# Patient Record
Sex: Female | Born: 1970 | Race: White | Hispanic: No | Marital: Married | Smoking: Former smoker
Health system: Southern US, Community
[De-identification: ages and names within clinical notes are randomized; demographics above are authoritative.]

## PROBLEM LIST (undated history)

## (undated) DIAGNOSIS — N2889 Other specified disorders of kidney and ureter: Secondary | ICD-10-CM

## (undated) DIAGNOSIS — K219 Gastro-esophageal reflux disease without esophagitis: Secondary | ICD-10-CM

## (undated) DIAGNOSIS — F419 Anxiety disorder, unspecified: Secondary | ICD-10-CM

## (undated) DIAGNOSIS — L509 Urticaria, unspecified: Secondary | ICD-10-CM

## (undated) DIAGNOSIS — L723 Sebaceous cyst: Secondary | ICD-10-CM

## (undated) DIAGNOSIS — F32A Depression, unspecified: Secondary | ICD-10-CM

## (undated) DIAGNOSIS — M1990 Unspecified osteoarthritis, unspecified site: Secondary | ICD-10-CM

## (undated) DIAGNOSIS — E0590 Thyrotoxicosis, unspecified without thyrotoxic crisis or storm: Secondary | ICD-10-CM

## (undated) DIAGNOSIS — F329 Major depressive disorder, single episode, unspecified: Secondary | ICD-10-CM

## (undated) HISTORY — PX: BREAST LUMPECTOMY: SHX2

## (undated) HISTORY — PX: COLONOSCOPY: SHX174

## (undated) HISTORY — DX: Urticaria, unspecified: L50.9

## (undated) HISTORY — PX: MOUTH SURGERY: SHX715

---

## 2016-08-29 IMAGING — US US BREAST*R* LIMITED INC AXILLA
1 series · 13 of 22 positions shown · non-contrast
Comparison: Baseline screening mammogram dated [DATE].

CLINICAL DATA: Screening recall for right breast mass and right
breast asymmetry.

EXAM:
2D DIGITAL DIAGNOSTIC UNILATERAL RIGHT MAMMOGRAM WITH CAD AND
ADJUNCT TOMO
RIGHT BREAST ULTRASOUND

[Series 1: us breast*right* limited inc axilla · 0.07mm/px · 13 of 22 slices shown]
[im 1/22]
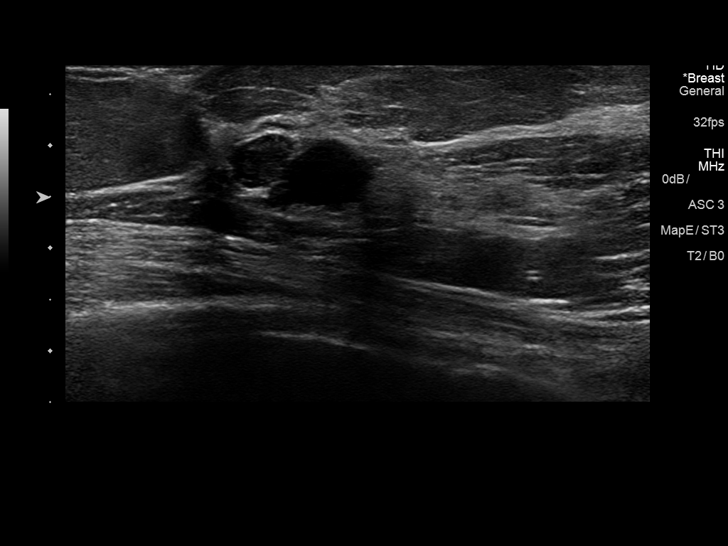
[im 3/22]
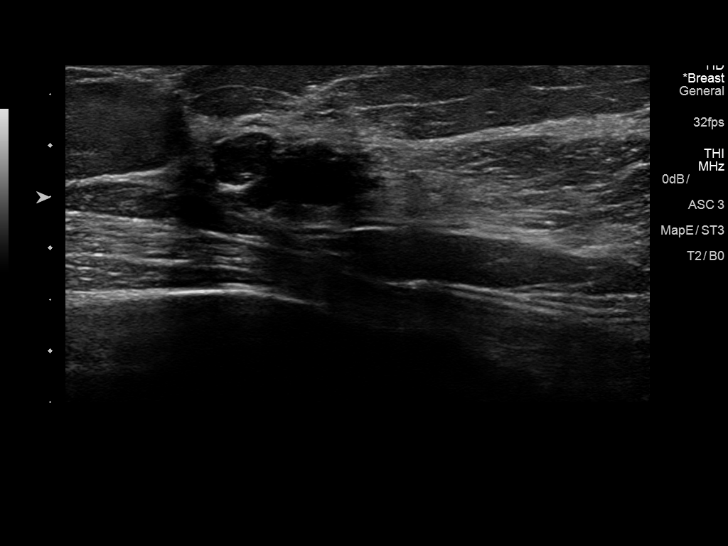
[im 5/22]
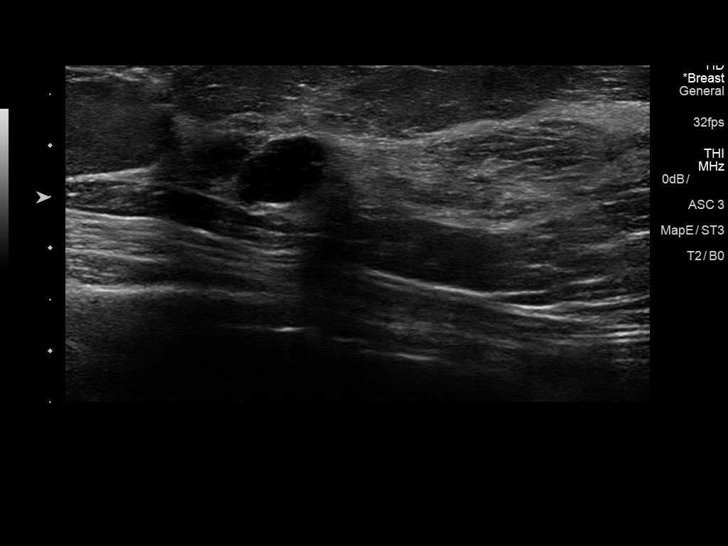
[im 6/22]
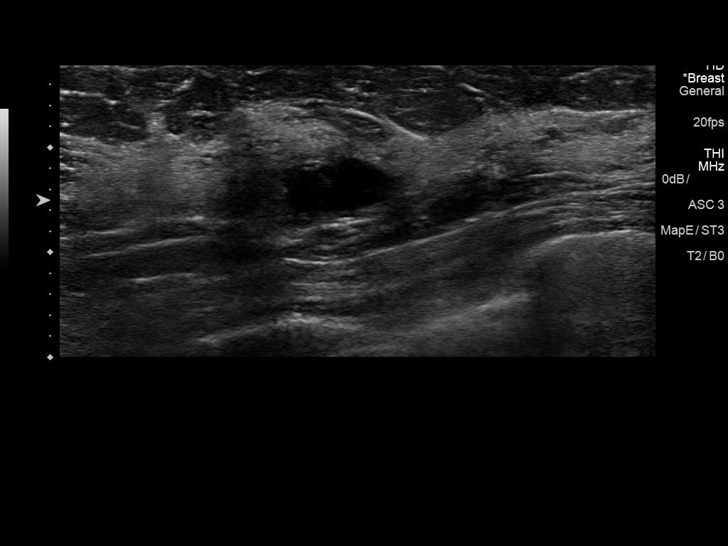
[im 8/22]
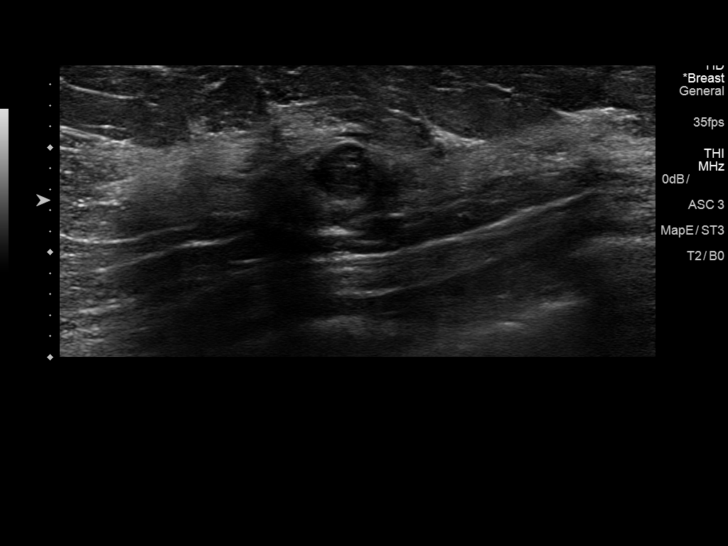
[im 10/22]
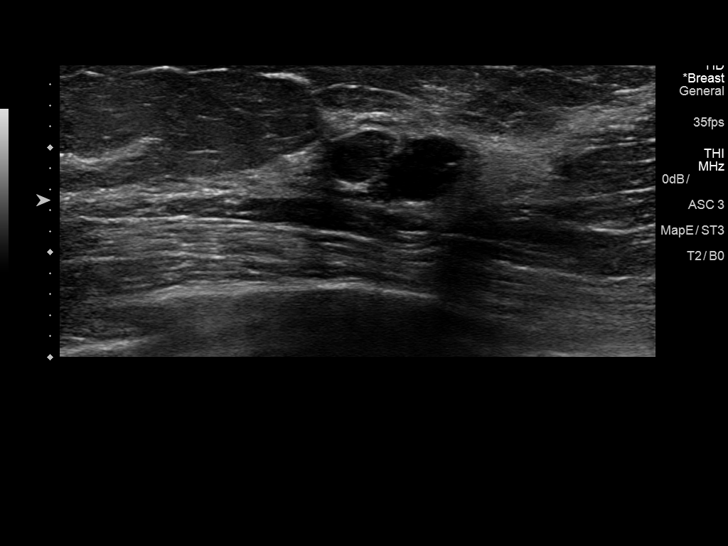
[im 12/22]
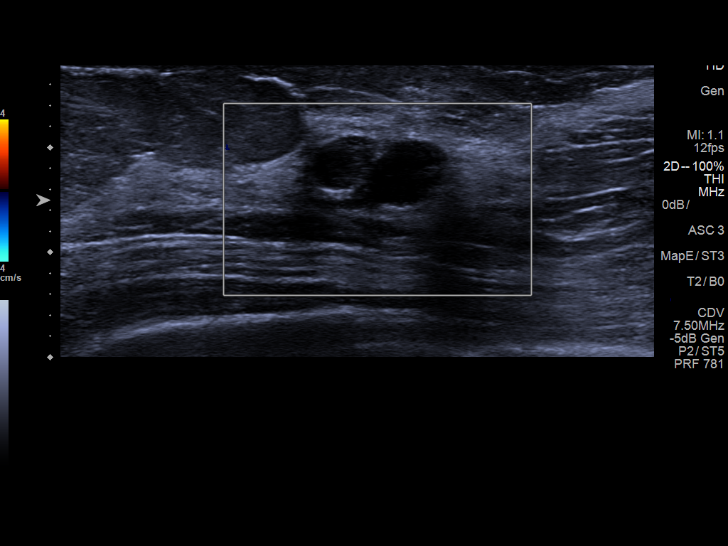
[im 13/22]
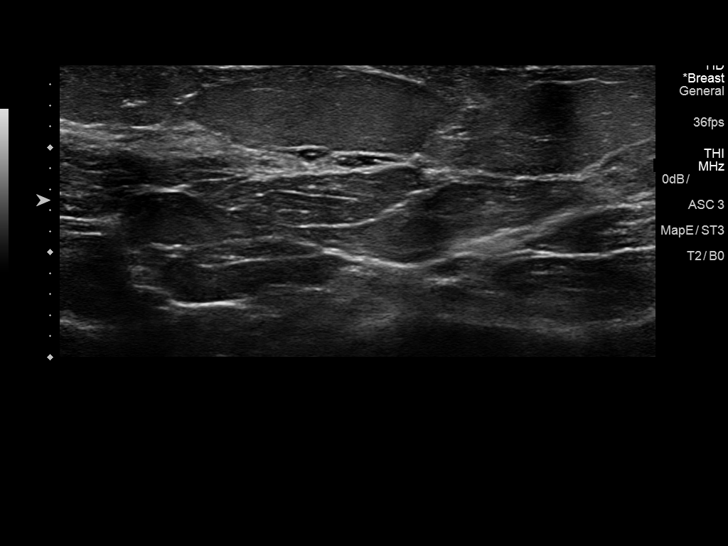
[im 15/22]
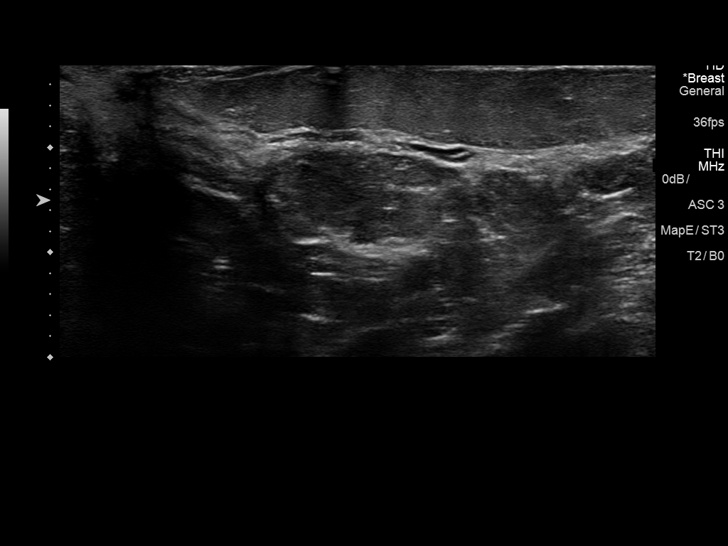
[im 17/22]
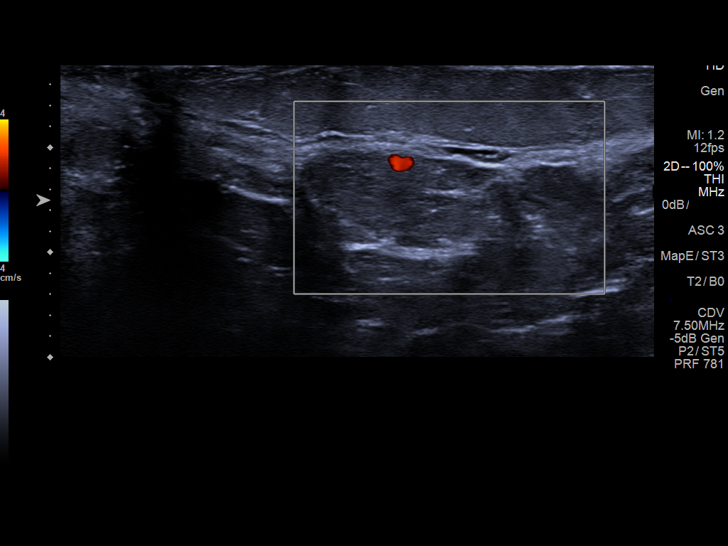
[im 18/22]
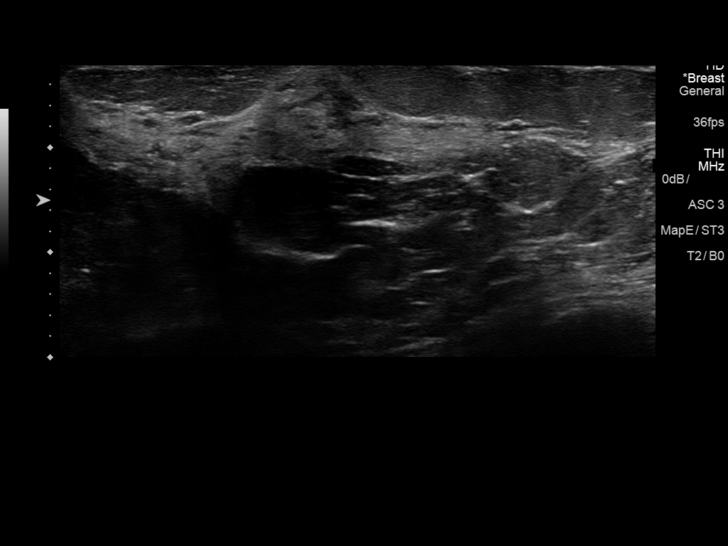
[im 20/22]
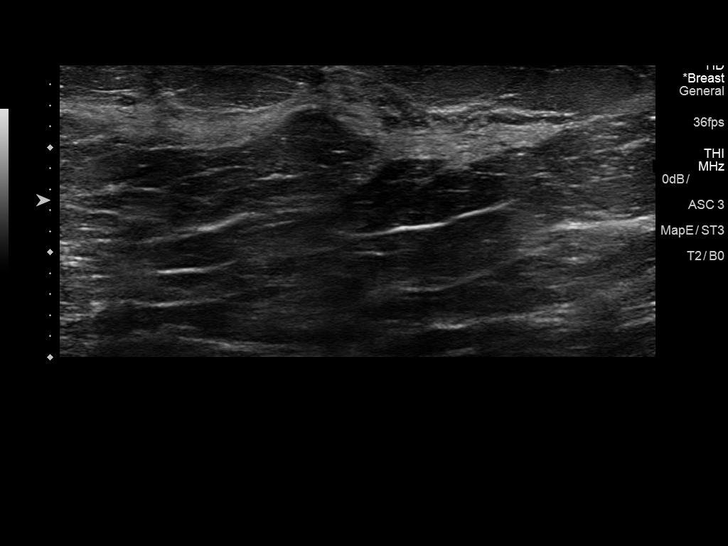
[im 22/22]
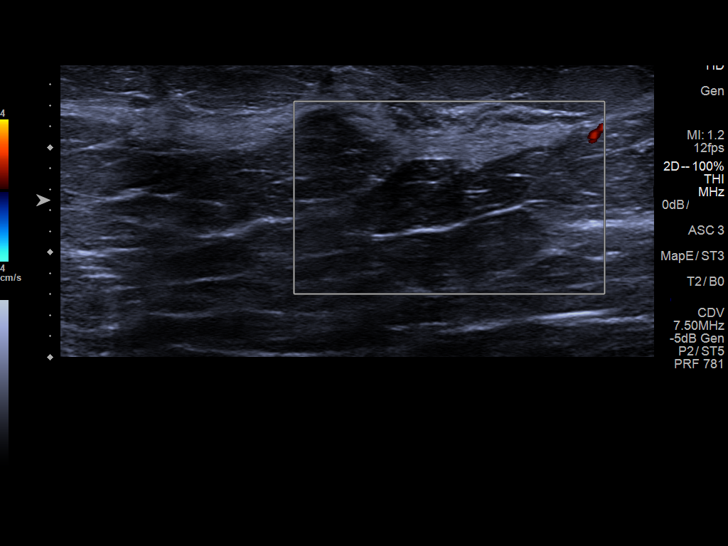

[13 of 22 positions shown; findings below may reference images not displayed]

ACR Breast Density Category c: The breast tissue is heterogeneously
dense, which may obscure small masses.
FINDINGS: Cc and MLO tomograms were performed of the right breast. There is a
persistent oval mass with obscured margins in the lower central
right breast measuring approximately 6-7 mm. In addition, there are
2 oval masses with obscured margins in the slightly upper outer
right breast measuring 7 in 9 mm.

Mammographic images were processed with CAD.

Physical examination of the outer right breast does not reveal any
palpable masses.

Targeted ultrasound of the right breast was performed demonstrating
a cluster of oval circumscribed hypoechoic masses with posterior
acoustic enhancement at 10 o'clock 6 cm from nipple with the larger
more anechoic mass measuring 1 x 0.7 x 1.1 cm and the smaller
hypoechoic mass measuring 0.5 x 0.5 x 0.6 cm. These masses
demonstrates imaging features suggestive of a cluster of complicated
cysts and corresponds well with the masses seen in the outer right
breast at mammography. Normal ducts are seen in the periareolar
right breast with a portion of a duct seen at 6 o'clock 3 cm from
nipple measuring 1 x 0.2 x 0.7 cm. There is an additional oval
circumscribed hypoechoic mass at 6 o'clock 3 cm from nipple
measuring 0.6 x 0.2 x 0.6 cm. This is felt to correspond well with
the mass seen in the central lower right breast at mammography.
IMPRESSION: Probably benign right breast masses.

RECOMMENDATION:
Diagnostic mammography and ultrasound of the right breast in 6
months to demonstrate stability of the probably benign masses in the
right breast at 10 o'clock 6 cm from nipple and also at 6 o'clock 3
cm from nipple.

I have discussed the findings and recommendations with the patient.
Results were also provided in writing at the conclusion of the
visit. If applicable, a reminder letter will be sent to the patient
regarding the next appointment.

BI-RADS CATEGORY  3: Probably benign.

## 2016-08-29 IMAGING — US US THYROID
1 series · 12 of 25 positions shown · non-contrast
Comparison: None.

CLINICAL DATA: 46-year-old female with a history of enlarged
thyroid

EXAM:
THYROID ULTRASOUND
TECHNIQUE: Ultrasound examination of the thyroid gland and adjacent soft
tissues was performed.

[Series 1: us thyroid · 0.08mm/px · 12 of 101 slices shown]
[im 5/101]
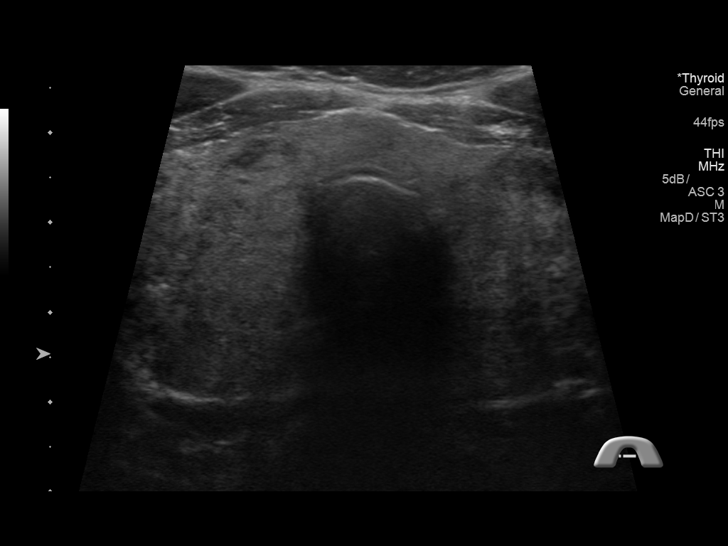
[im 13/101]
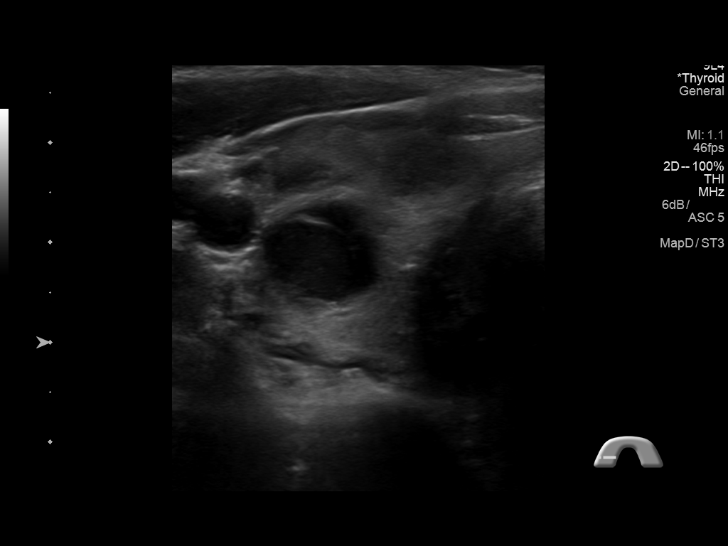
[im 21/101]
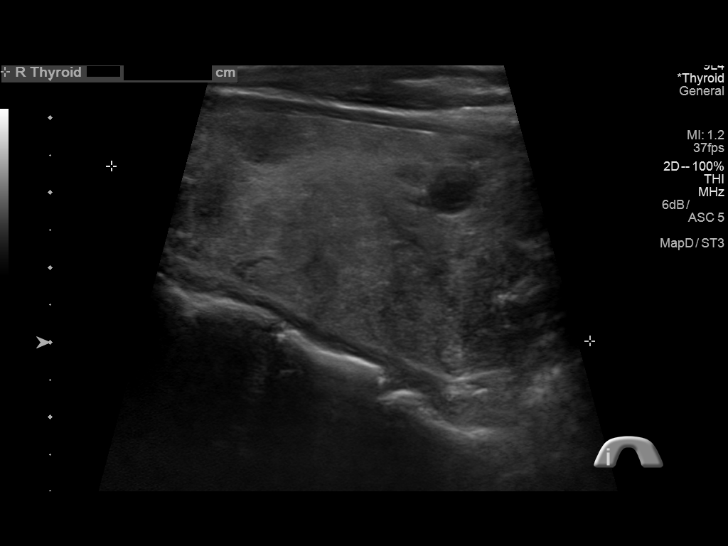
[im 30/101]
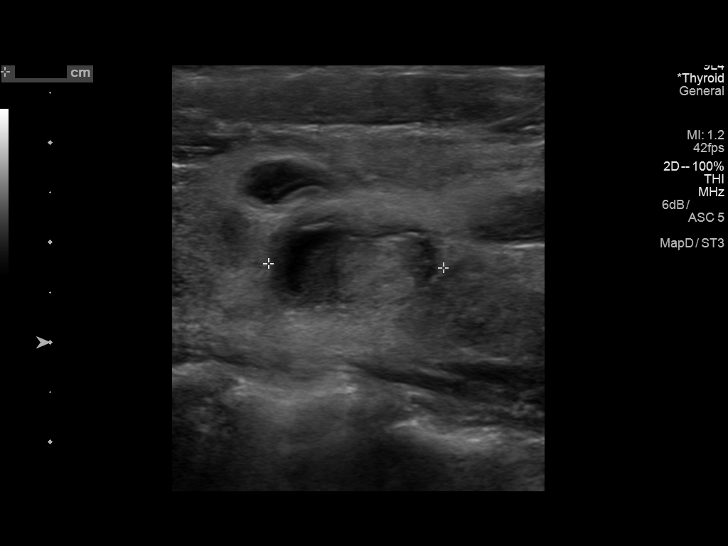
[im 38/101]
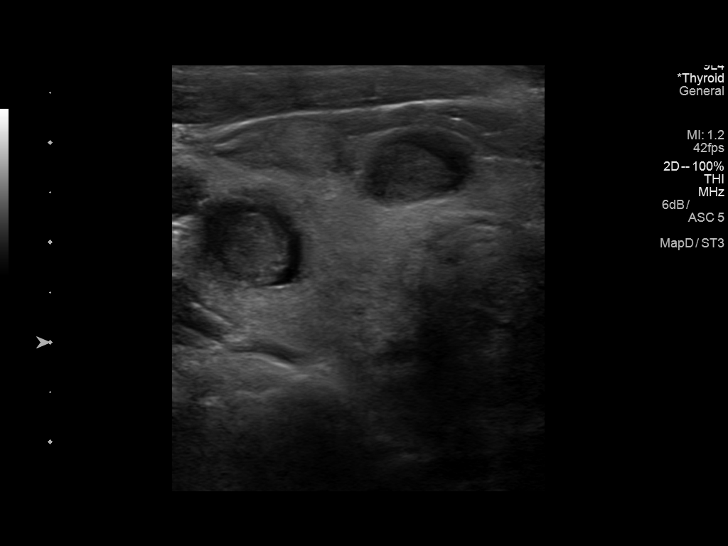
[im 46/101]
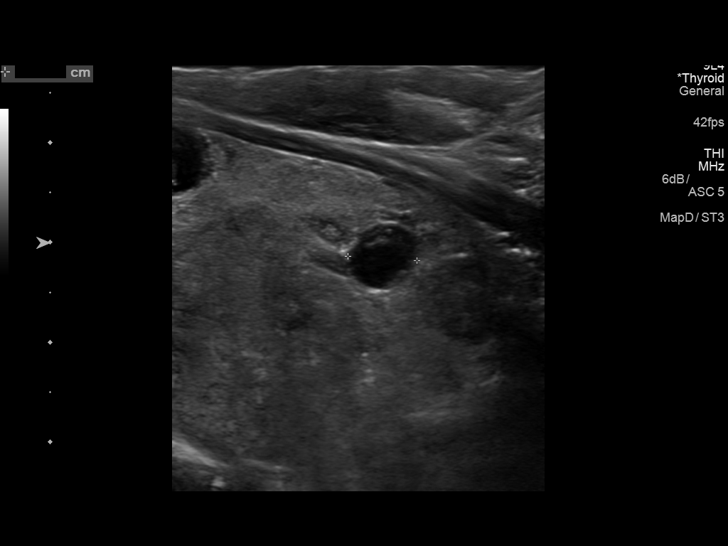
[im 55/101]
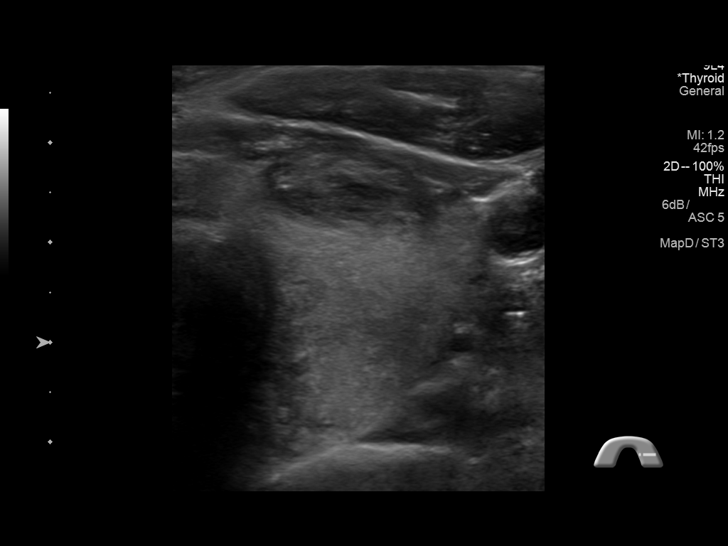
[im 63/101]
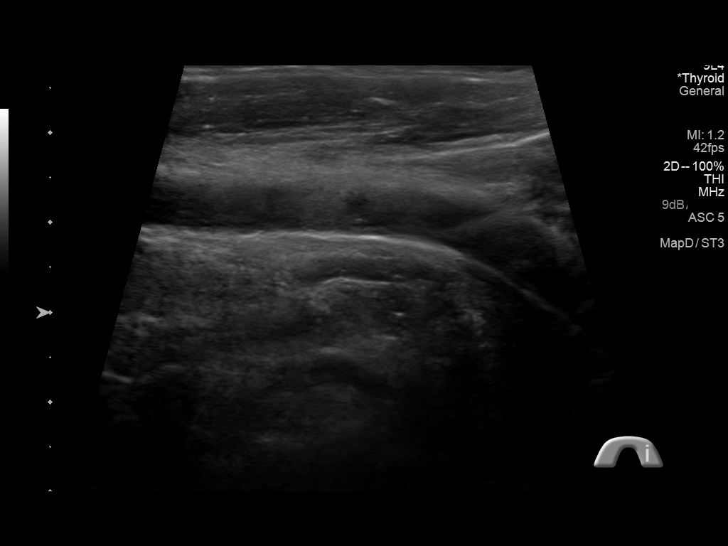
[im 71/101]
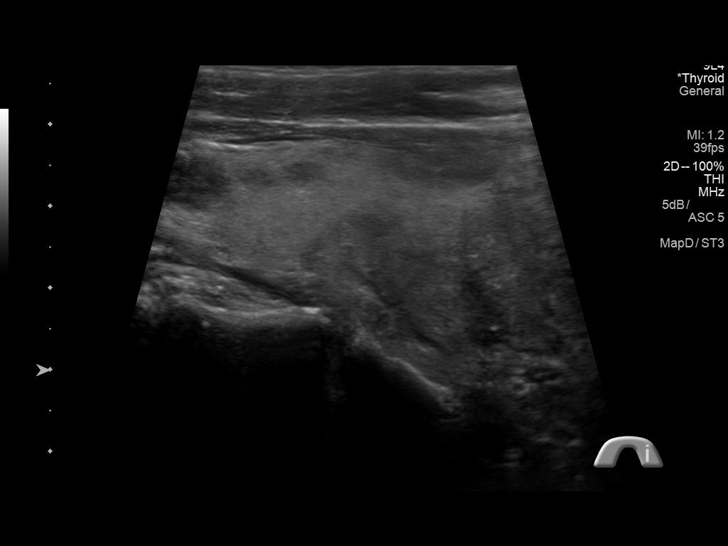
[im 80/101]
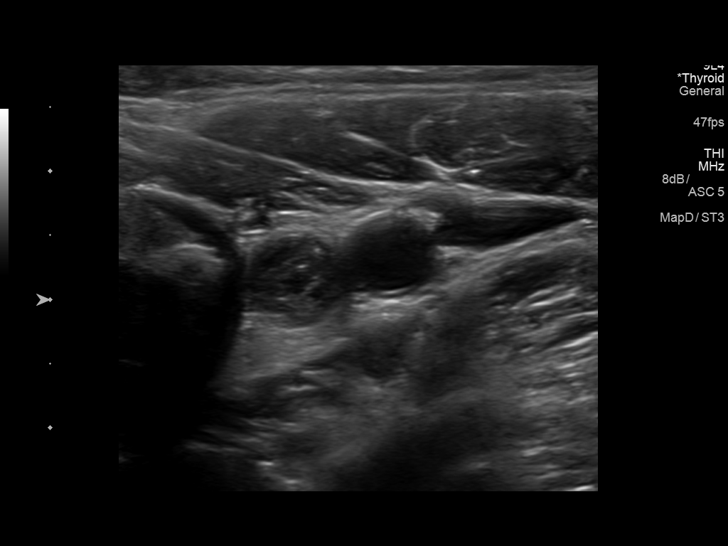
[im 88/101]
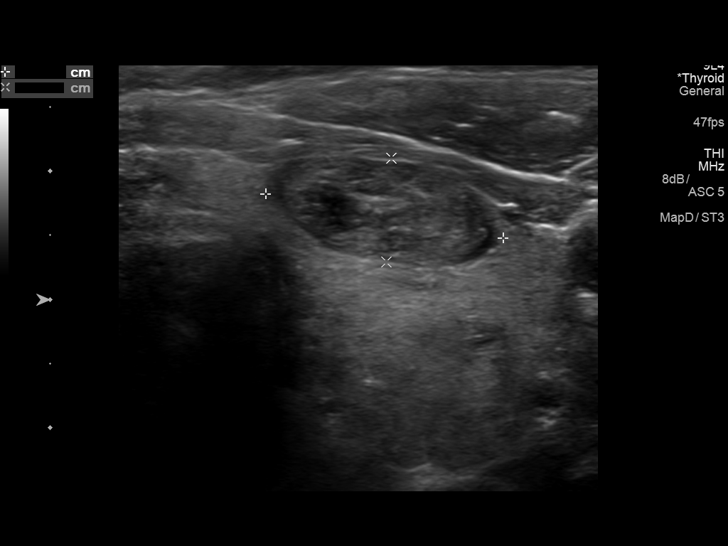
[im 96/101]
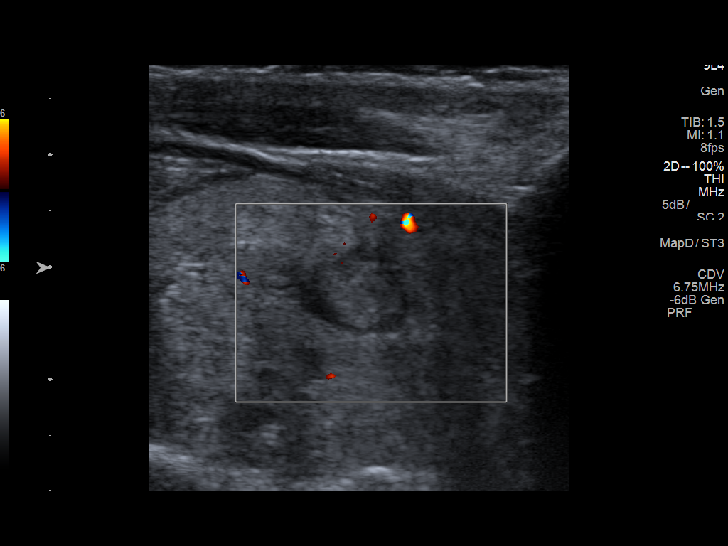

[12 of 25 positions shown; findings below may reference images not displayed]

FINDINGS: Parenchymal Echotexture: Mildly heterogenous

Isthmus: 0.6 cm

Right lobe: 8.3 cm x 3.4 cm x 2.7 cm

Left lobe: 7.8 cm x 3.1 cm x 2.6 cm

_________________________________________________________

Estimated total number of nodules >/= 1 cm: 6-10

Number of spongiform nodules >/=  2 cm not described below (TR1): 0

Number of mixed cystic and solid nodules >/= 1.5 cm not described
below (TR2): 0

_________________________________________________________

Nodule # 1:

Location: Right; Superior

Maximum size: 1.8 cm; Other 2 dimensions: 1.0 cm x 1.0 cm

Composition: mixed cystic and solid (1)

Echogenicity: isoechoic (1)

Shape: not taller-than-wide (0)

Margins: smooth (0)

Echogenic foci: none (0)

ACR TI-RADS total points: 2.

ACR TI-RADS risk category: TR2 (2 points).

ACR TI-RADS recommendations:

Nodule does not meet criteria for surveillance or biopsy

_________________________________________________________

Nodule # 2:

Location: Right; Mid

Maximum size: 1.2 cm; Other 2 dimensions: 1.2 cm x 0.8 cm

Composition: mixed cystic and solid (1)

Echogenicity: isoechoic (1)

Shape: not taller-than-wide (0)

Margins: smooth (0)

Echogenic foci: none (0)

ACR TI-RADS total points: 2.

ACR TI-RADS risk category: TR2 (2 points).

ACR TI-RADS recommendations:

Nodule does not meet criteria for surveillance or biopsy

_________________________________________________________

Nodule # 3:

Location: Left; Superior

Maximum size: 1.1 cm; Other 2 dimensions: 0.8 cm x 0.8 cm

Composition: spongiform (0)

ACR TI-RADS recommendations:

Spongiform nodule does not meet criteria for surveillance or biopsy

_________________________________________________________

Nodule # 4:

Location: Left; Mid

Maximum size: 2.3 cm; Other 2 dimensions: 1.9 cm x 0.8 cm

Composition: spongiform (0)

ACR TI-RADS recommendations:

Spongiform nodule does not meet criteria for surveillance or biopsy

_________________________________________________________

Nodule # 5:

Location: Left; Inferior

Maximum size: 1.1 cm; Other 2 dimensions: 1.0 cm x 0.9 cm

Composition: solid/almost completely solid (2)

Echogenicity: isoechoic (1)

Shape: not taller-than-wide (0)

Margins: smooth (0)

Echogenic foci: none (0)

ACR TI-RADS total points: 3.

ACR TI-RADS risk category: TR3 (3 points).

ACR TI-RADS recommendations:

Nodule does not meet criteria for surveillance or biopsy

_________________________________________________________

Nodule # 6:

Location: Left; Inferior

Maximum size: 2.1 cm; Other 2 dimensions: 1.9 cm x 1.7 cm

Composition: mixed cystic and solid (1)

Echogenicity: isoechoic (1)

Shape: not taller-than-wide (0)

Margins: extra-thyroidal extension (3)

Echogenic foci: none (0)

ACR TI-RADS total points: 5.

ACR TI-RADS risk category: TR4 (4-6 points).

ACR TI-RADS recommendations:

Nodule meets criteria for surveillance

_________________________________________________________

No adenopathy
IMPRESSION: Multinodular thyroid.

Left inferior thyroid nodule (labeled 6) meets criteria for
surveillance, as designated by the newly established ACR TI-RADS
criteria. Surveillance ultrasound study recommended to be performed
annually up to 5 years.

Remainder of nodules do not meet criteria for surveillance or
biopsy.

Recommendations follow those established by the new ACR TI-RADS
criteria ([HOSPITAL] [HL];[DATE]).

## 2017-09-28 ENCOUNTER — Other Ambulatory Visit (HOSPITAL_COMMUNITY): Payer: Self-pay | Admitting: Internal Medicine

## 2017-09-28 DIAGNOSIS — Z1231 Encounter for screening mammogram for malignant neoplasm of breast: Secondary | ICD-10-CM

## 2017-10-04 ENCOUNTER — Encounter (HOSPITAL_COMMUNITY): Payer: Self-pay | Admitting: Radiology

## 2017-10-04 ENCOUNTER — Ambulatory Visit (HOSPITAL_COMMUNITY)
Admission: RE | Admit: 2017-10-04 | Discharge: 2017-10-04 | Disposition: A | Discharge status: Home / Self Care | Payer: BC Managed Care – PPO | Source: Ambulatory Visit | Attending: Internal Medicine | Admitting: Internal Medicine

## 2017-10-04 DIAGNOSIS — Z1231 Encounter for screening mammogram for malignant neoplasm of breast: Secondary | ICD-10-CM | POA: Diagnosis not present

## 2017-10-04 IMAGING — MG DIGITAL SCREENING BILATERAL MAMMOGRAM WITH CAD
4 series · 4 of 4 positions shown · non-contrast
Comparison: None.

CLINICAL DATA: Screening.

EXAM:
DIGITAL SCREENING BILATERAL MAMMOGRAM WITH CAD

[R MLO]
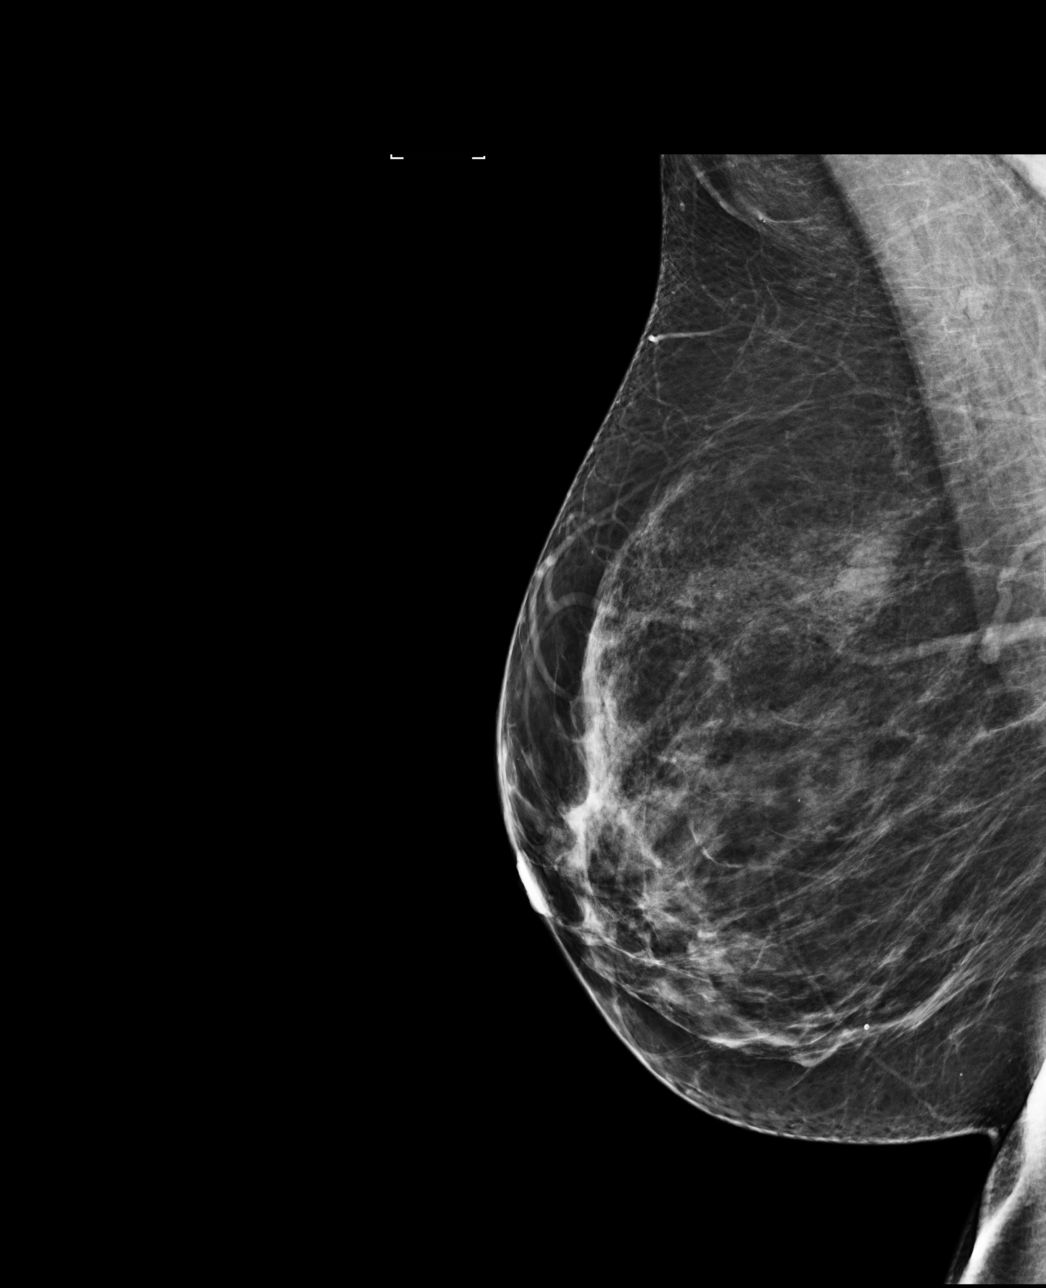

[R CC]
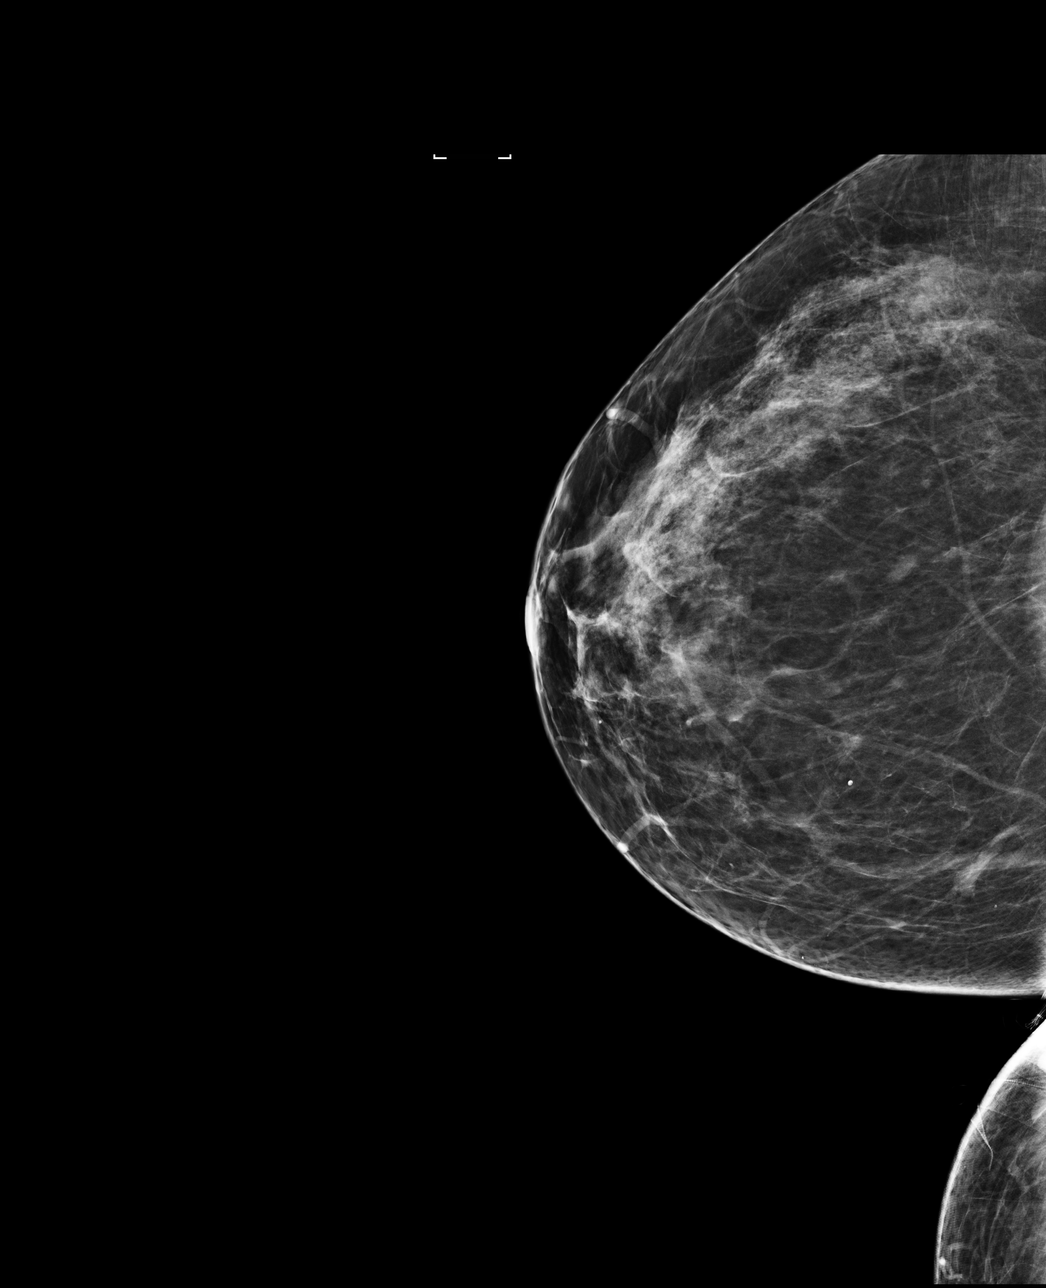

[L MLO]
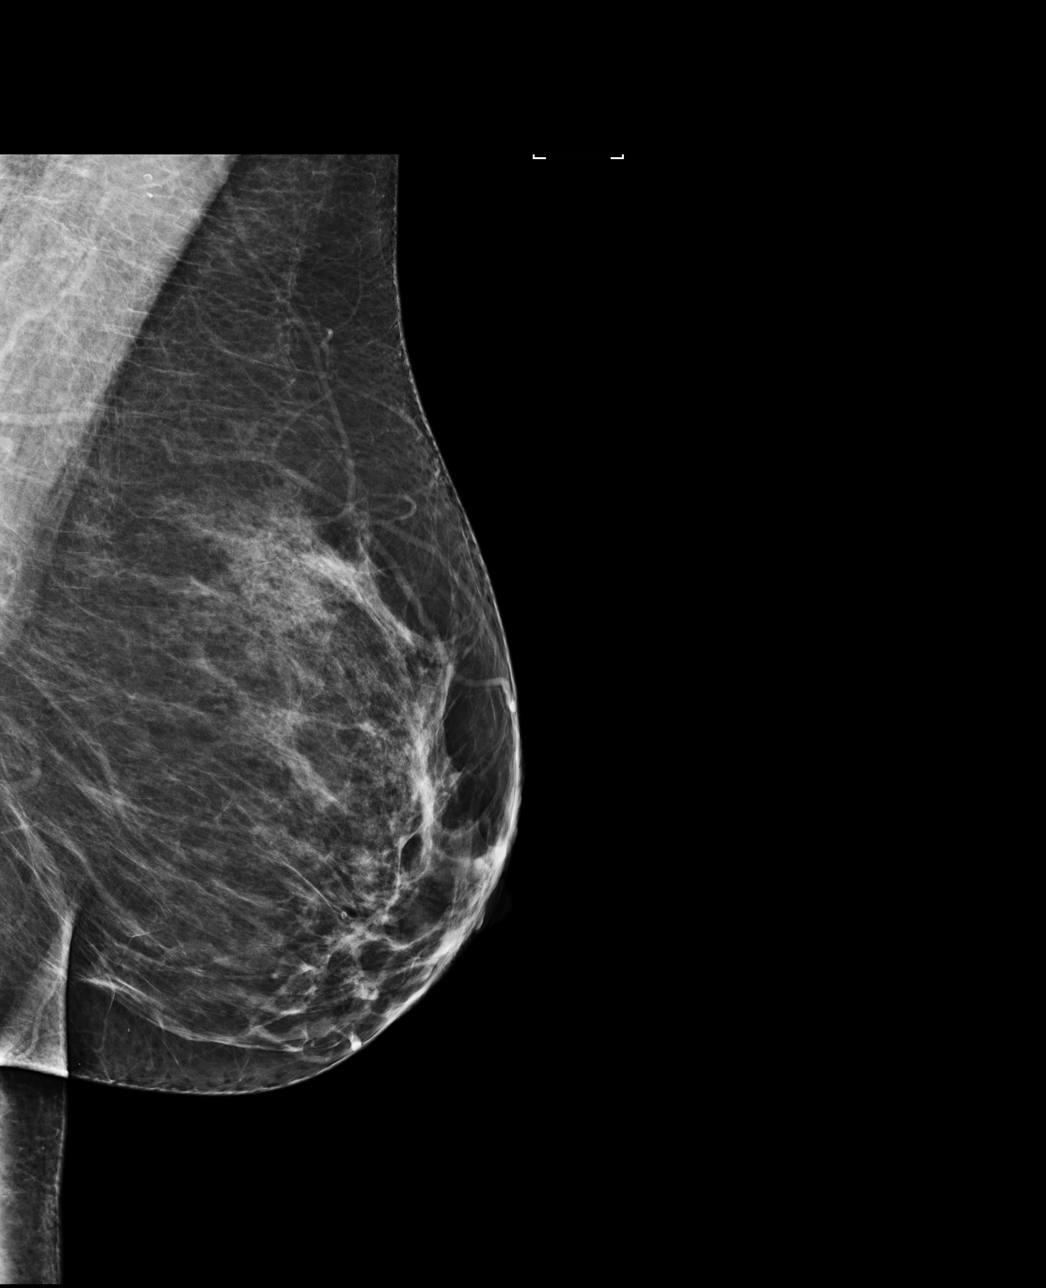

[L CC]
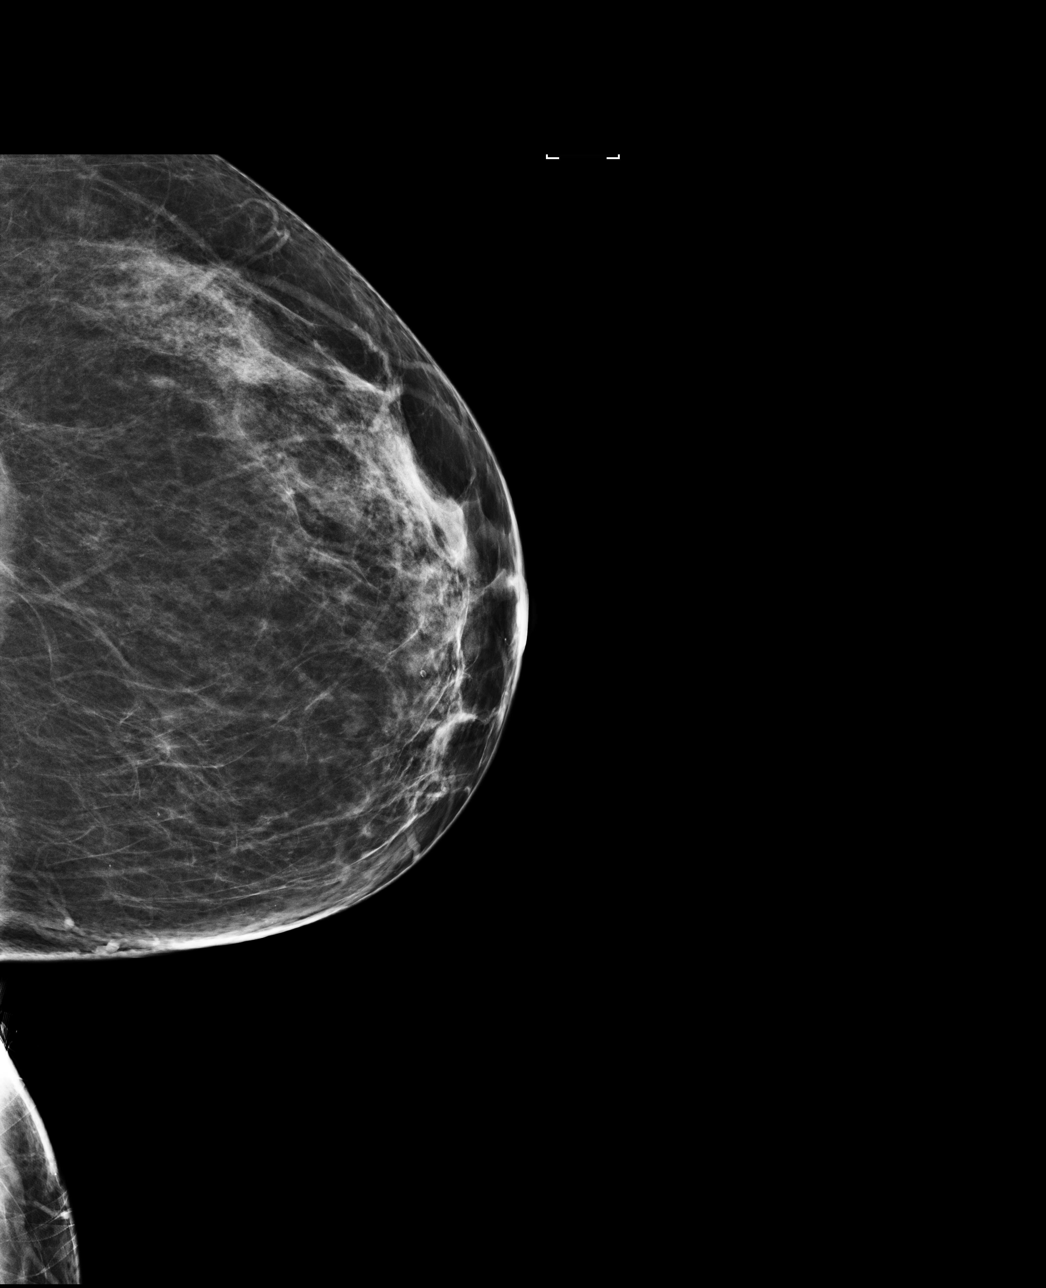

[4 of 4 positions shown; findings below may reference images not displayed]

ACR Breast Density Category c: The breast tissue is heterogeneously
dense, which may obscure small masses.
FINDINGS: In the right breast, a possible mass and an asymmetry warrants
further evaluation. In the left breast, no findings suspicious for
malignancy. Images were processed with CAD.
IMPRESSION: Further evaluation is suggested for possible mass and asymmetry in
the right breast.

RECOMMENDATION:
Diagnostic mammogram and possibly ultrasound of the right breast.
(Code:[VJ])

The patient will be contacted regarding the findings, and additional
imaging will be scheduled.

BI-RADS CATEGORY  0: Incomplete. Need additional imaging evaluation
and/or prior mammograms for comparison.

## 2017-10-05 ENCOUNTER — Other Ambulatory Visit: Payer: Self-pay | Admitting: Internal Medicine

## 2017-10-05 DIAGNOSIS — E049 Nontoxic goiter, unspecified: Secondary | ICD-10-CM

## 2017-10-06 ENCOUNTER — Other Ambulatory Visit (HOSPITAL_COMMUNITY): Payer: Self-pay | Admitting: Internal Medicine

## 2017-10-06 DIAGNOSIS — IMO0002 Reserved for concepts with insufficient information to code with codable children: Secondary | ICD-10-CM

## 2017-10-06 DIAGNOSIS — R229 Localized swelling, mass and lump, unspecified: Principal | ICD-10-CM

## 2017-10-10 ENCOUNTER — Ambulatory Visit (HOSPITAL_COMMUNITY)
Admission: RE | Admit: 2017-10-10 | Discharge: 2017-10-10 | Disposition: A | Discharge status: Home / Self Care | Payer: BC Managed Care – PPO | Source: Ambulatory Visit | Attending: Internal Medicine | Admitting: Internal Medicine

## 2017-10-10 DIAGNOSIS — E049 Nontoxic goiter, unspecified: Secondary | ICD-10-CM | POA: Insufficient documentation

## 2017-10-10 DIAGNOSIS — N6489 Other specified disorders of breast: Secondary | ICD-10-CM | POA: Diagnosis not present

## 2017-10-10 DIAGNOSIS — N6310 Unspecified lump in the right breast, unspecified quadrant: Secondary | ICD-10-CM | POA: Diagnosis not present

## 2017-10-10 DIAGNOSIS — IMO0002 Reserved for concepts with insufficient information to code with codable children: Secondary | ICD-10-CM

## 2017-10-10 DIAGNOSIS — E042 Nontoxic multinodular goiter: Secondary | ICD-10-CM | POA: Insufficient documentation

## 2017-10-10 DIAGNOSIS — N6314 Unspecified lump in the right breast, lower inner quadrant: Secondary | ICD-10-CM | POA: Diagnosis not present

## 2017-10-10 DIAGNOSIS — R229 Localized swelling, mass and lump, unspecified: Principal | ICD-10-CM

## 2017-10-10 DIAGNOSIS — N6313 Unspecified lump in the right breast, lower outer quadrant: Secondary | ICD-10-CM | POA: Diagnosis not present

## 2017-10-10 IMAGING — MG 2D DIGITAL DIAGNOSTIC UNILATERAL RIGHT MAMMOGRAM WITH CAD AND AD
4 series · 6 of 12 positions shown · non-contrast
Comparison: Baseline screening mammogram dated [DATE].

CLINICAL DATA: Screening recall for right breast mass and right
breast asymmetry.

EXAM:
2D DIGITAL DIAGNOSTIC UNILATERAL RIGHT MAMMOGRAM WITH CAD AND
ADJUNCT TOMO
RIGHT BREAST ULTRASOUND

[R MLO]
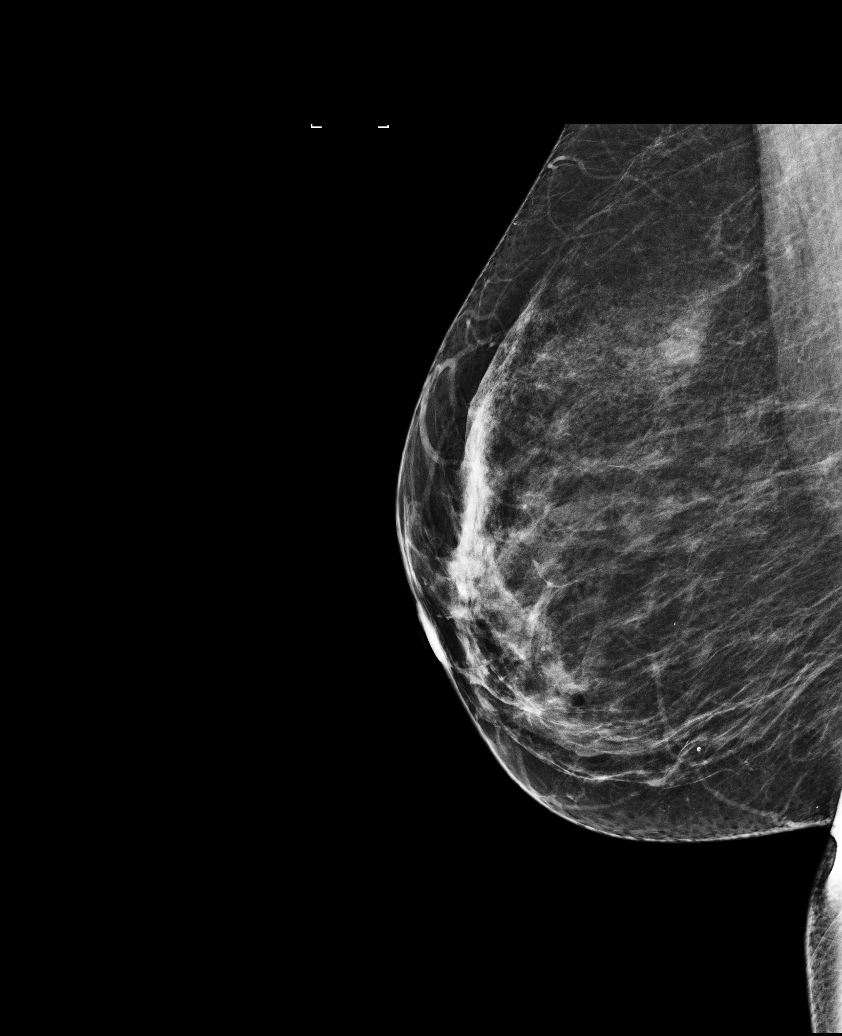

[R CC]
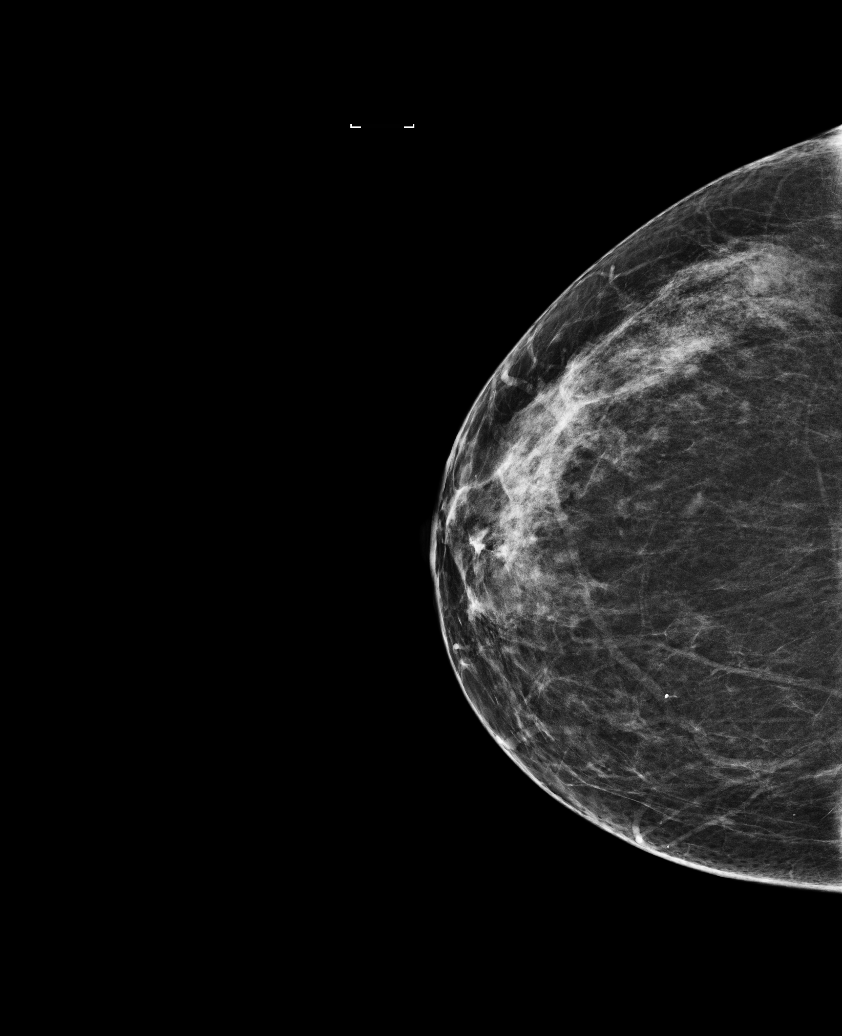

[R MLO tomo · 3 of 65 frames shown]
[frame 21/65]
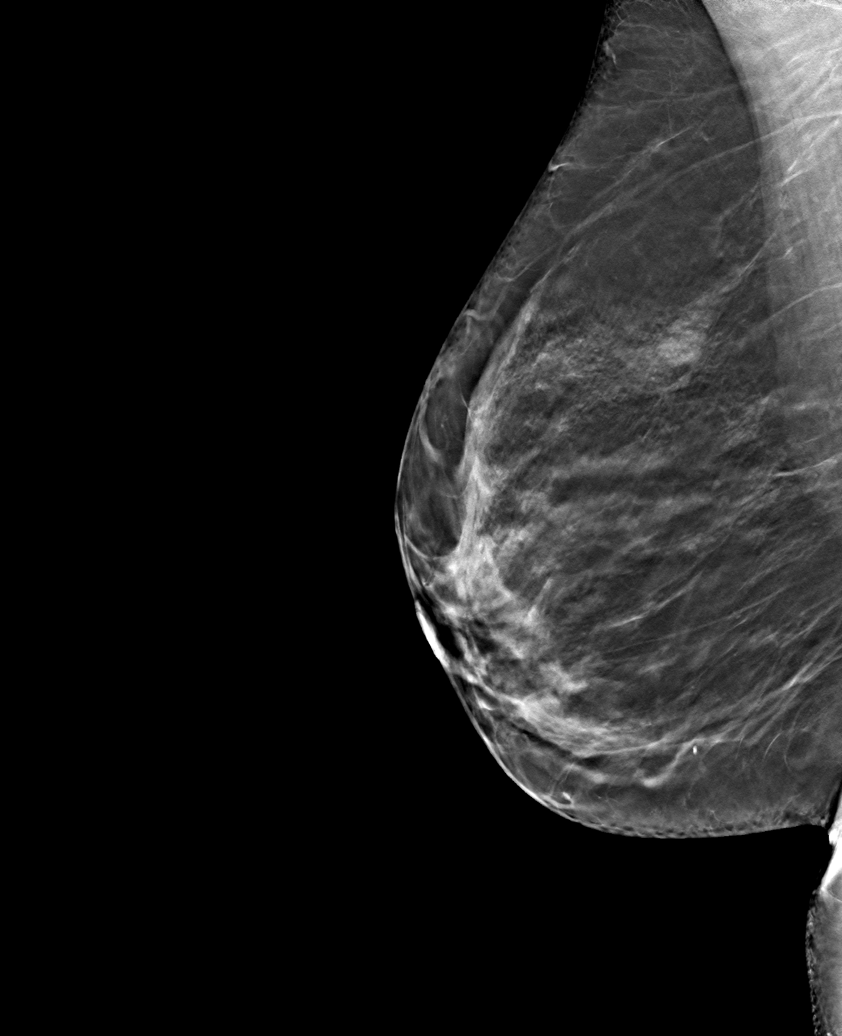
[frame 33/65]
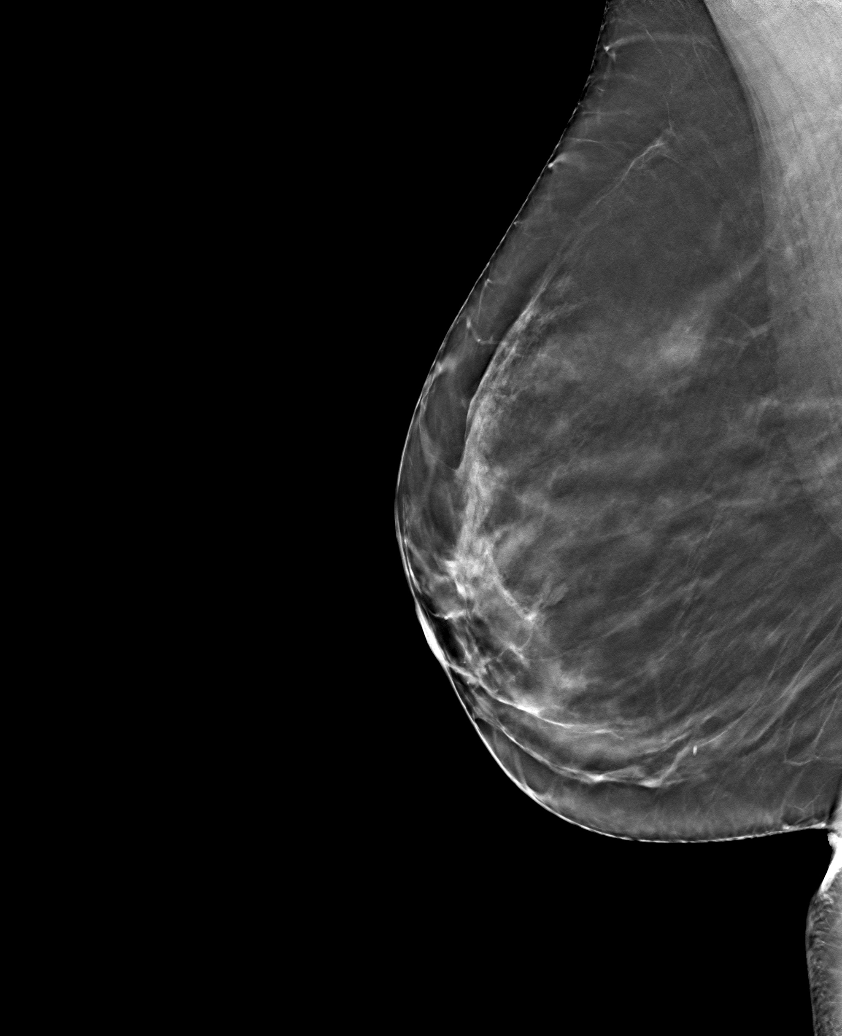
[frame 45/65]
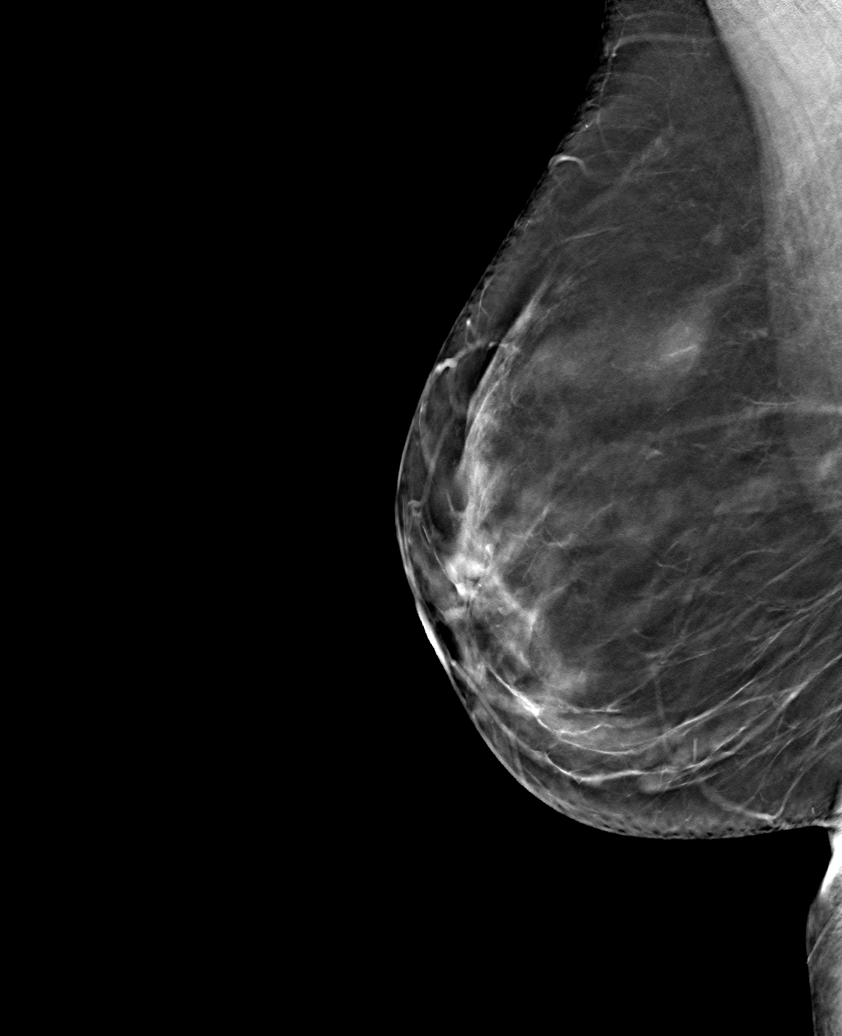

[R CC tomo · tomo slice 33/64.0]
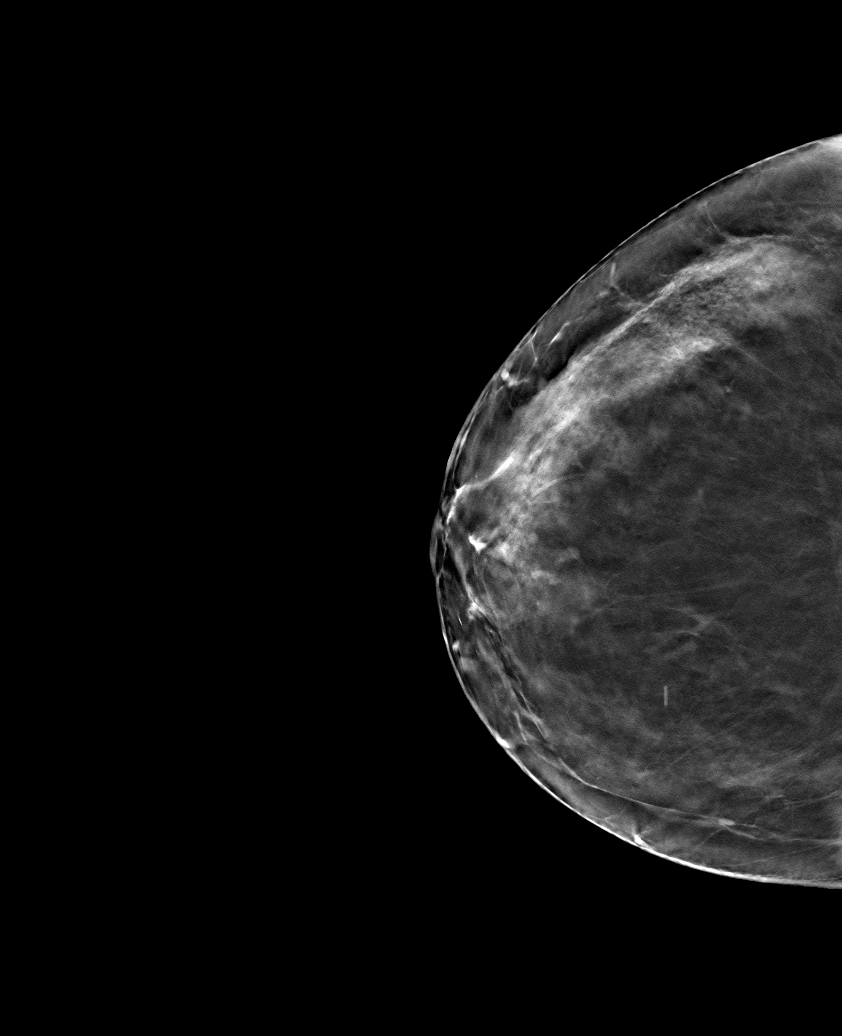

[6 of 12 positions shown; findings below may reference images not displayed]

ACR Breast Density Category c: The breast tissue is heterogeneously
dense, which may obscure small masses.
FINDINGS: Cc and MLO tomograms were performed of the right breast. There is a
persistent oval mass with obscured margins in the lower central
right breast measuring approximately 6-7 mm. In addition, there are
2 oval masses with obscured margins in the slightly upper outer
right breast measuring 7 in 9 mm.

Mammographic images were processed with CAD.

Physical examination of the outer right breast does not reveal any
palpable masses.

Targeted ultrasound of the right breast was performed demonstrating
a cluster of oval circumscribed hypoechoic masses with posterior
acoustic enhancement at 10 o'clock 6 cm from nipple with the larger
more anechoic mass measuring 1 x 0.7 x 1.1 cm and the smaller
hypoechoic mass measuring 0.5 x 0.5 x 0.6 cm. These masses
demonstrates imaging features suggestive of a cluster of complicated
cysts and corresponds well with the masses seen in the outer right
breast at mammography. Normal ducts are seen in the periareolar
right breast with a portion of a duct seen at 6 o'clock 3 cm from
nipple measuring 1 x 0.2 x 0.7 cm. There is an additional oval
circumscribed hypoechoic mass at 6 o'clock 3 cm from nipple
measuring 0.6 x 0.2 x 0.6 cm. This is felt to correspond well with
the mass seen in the central lower right breast at mammography.
IMPRESSION: Probably benign right breast masses.

RECOMMENDATION:
Diagnostic mammography and ultrasound of the right breast in 6
months to demonstrate stability of the probably benign masses in the
right breast at 10 o'clock 6 cm from nipple and also at 6 o'clock 3
cm from nipple.

I have discussed the findings and recommendations with the patient.
Results were also provided in writing at the conclusion of the
visit. If applicable, a reminder letter will be sent to the patient
regarding the next appointment.

BI-RADS CATEGORY  3: Probably benign.

## 2017-10-17 ENCOUNTER — Encounter (HOSPITAL_COMMUNITY): Payer: BC Managed Care – PPO

## 2017-11-06 IMAGING — US ULTRASOUND RIGHT BREAST LIMITED
1 series · 3 of 3 positions shown · non-contrast
Comparison: Previous exam(s).

CLINICAL DATA: Interval follow-up of a likely benign mass involving
the UPPER OUTER QUADRANT of the RIGHT breast. The patient had been
recommended for six-month follow-up and now returns at 14 months.
Patient has palpated a lump in this location for approximately 1
month. Annual evaluation, LEFT breast.

EXAM:
DIGITAL DIAGNOSTIC BILATERAL MAMMOGRAM WITH CAD AND TOMO
ULTRASOUND RIGHT BREAST

[Series 1: ultrasound right breast limited · 0.07mm/px · 3 acquisitions, 3 frames shown]
[im 1/3]
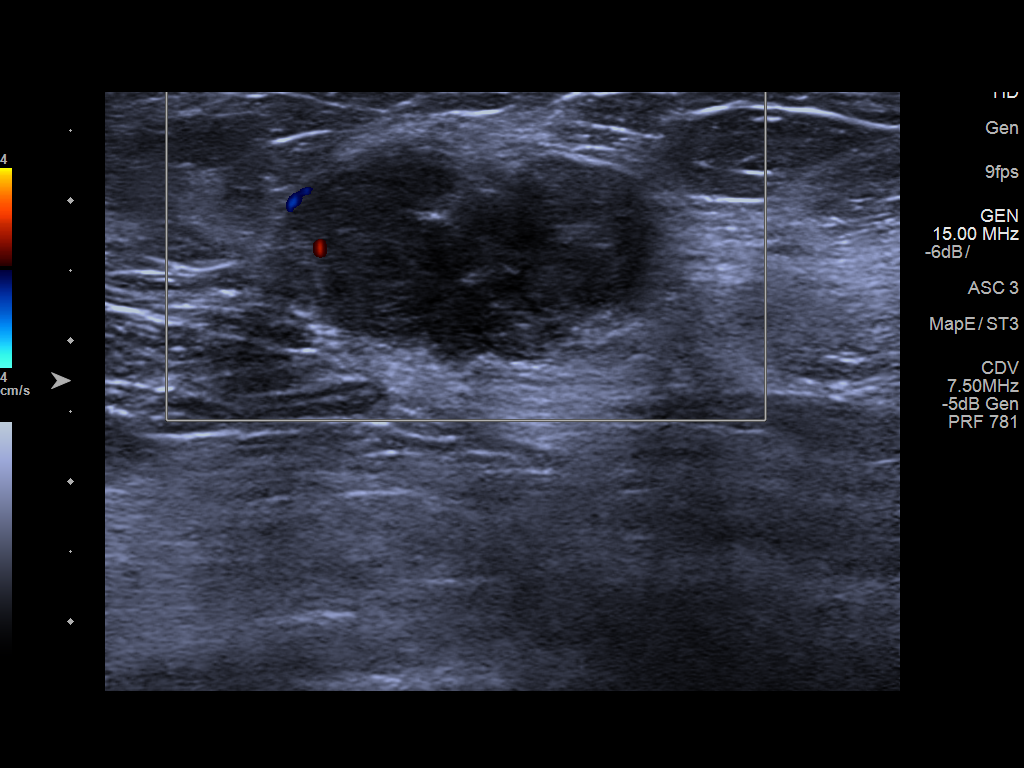
[im 2/3]
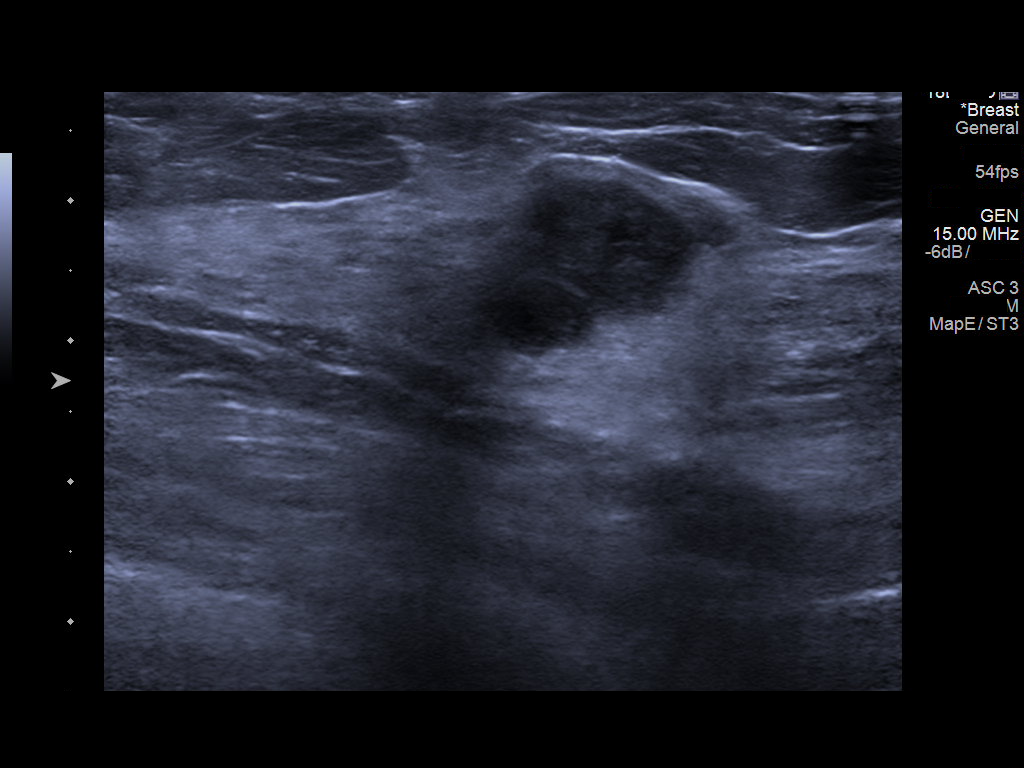
[im 3/3]
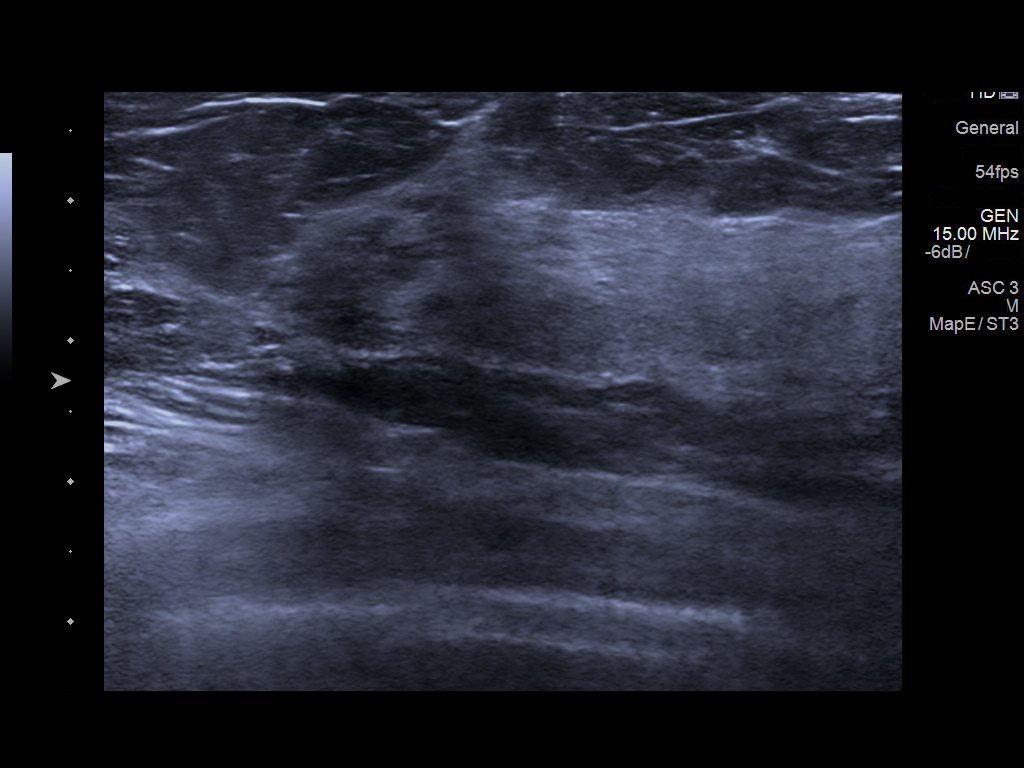

[3 of 3 positions shown; findings below may reference images not displayed]

ACR Breast Density Category c: The breast tissue is heterogeneously
dense, which may obscure small masses.
FINDINGS: Tomosynthesis and synthesized full field CC and MLO views of both
breasts were obtained. Tomosynthesis and synthesized spot
compression tangential view of the area of palpable concern in the
RIGHT breast was also obtained.

Interval increase in size of the previously identified mass in the
UPPER OUTER QUADRANT of the RIGHT breast at POSTERIOR depth, now
measuring greater than 3 cm, with lobulated margins. There is no
associated architectural distortion or suspicious calcifications. No
new or suspicious findings elsewhere in the RIGHT breast.

No findings suspicious for malignancy in the LEFT breast.

Mammographic images were processed with CAD.

On physical exam, there is a mobile palpable approximate 3 cm mass
in the UPPER OUTER QUADRANT of the RIGHT breast corresponding to
what the patient is feeling.

Targeted RIGHT breast ultrasound is performed, showing a solid
hypoechoic mass with irregular margins at the 10 o'clock position
approximately 8 cm from the nipple measuring approximately 1.5 x
x 2.7 cm, demonstrating posterior acoustic enhancement and
peripheral internal color Doppler flow, corresponding to the
palpable concern and the mammographic finding.

Sonographic evaluation of the RIGHT axilla demonstrates no
pathologic lymphadenopathy.
IMPRESSION: 1. Suspicious approximate 3.3 cm mass involving the UPPER OUTER
QUADRANT of the RIGHT breast corresponding to the palpable concern.
2. No pathologic RIGHT axillary lymphadenopathy.

RECOMMENDATION:
Ultrasound-guided core needle biopsy of the RIGHT breast mass.

The ultrasound core needle biopsy procedure was discussed with the
patient and her questions were answered. She has agreed to proceed
and the biopsy has been scheduled for [DATE] at 8 o'clock a.m..

I have discussed the findings and recommendations with the patient.
Results were also provided in writing at the conclusion of the
visit.

BI-RADS CATEGORY  4: Suspicious.

## 2017-11-13 IMAGING — US US BREAST BX W LOC DEV 1ST LESION IMG BX SPEC US GUIDE*R*
1 series · 7 of 7 positions shown · non-contrast
Comparison: Previous exam(s).

Addendum:
CLINICAL DATA: 40-year-old female with a suspicious mass in the
right breast at the 10 o'clock position.

EXAM:
ULTRASOUND GUIDED RIGHT BREAST CORE NEEDLE BIOPSY

[Series 1: us breast bx w loc dev 1st lesion img bx spec us g · 0.07mm/px · 7 of 7 slices shown]
[im 1/7]
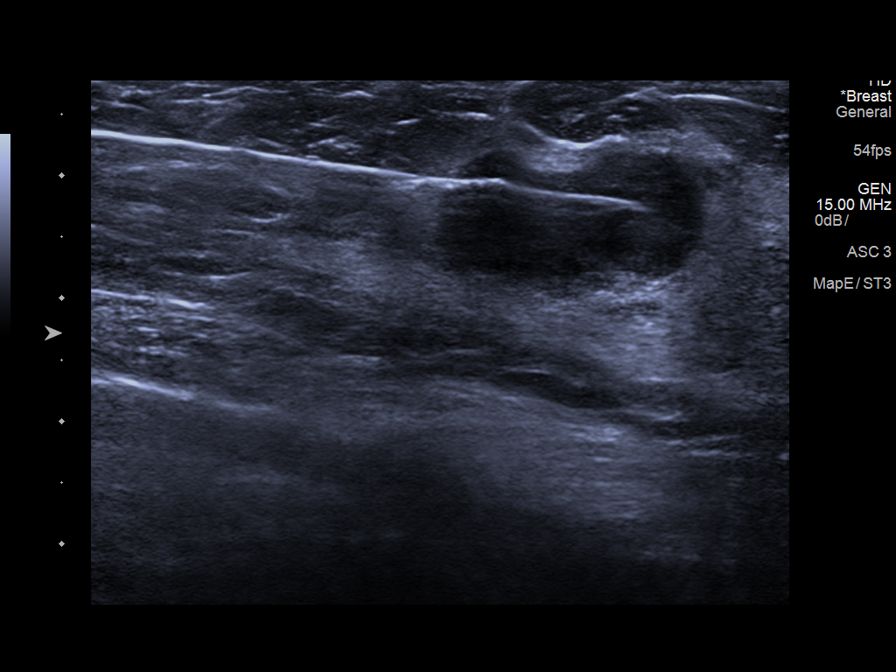
[im 2/7]
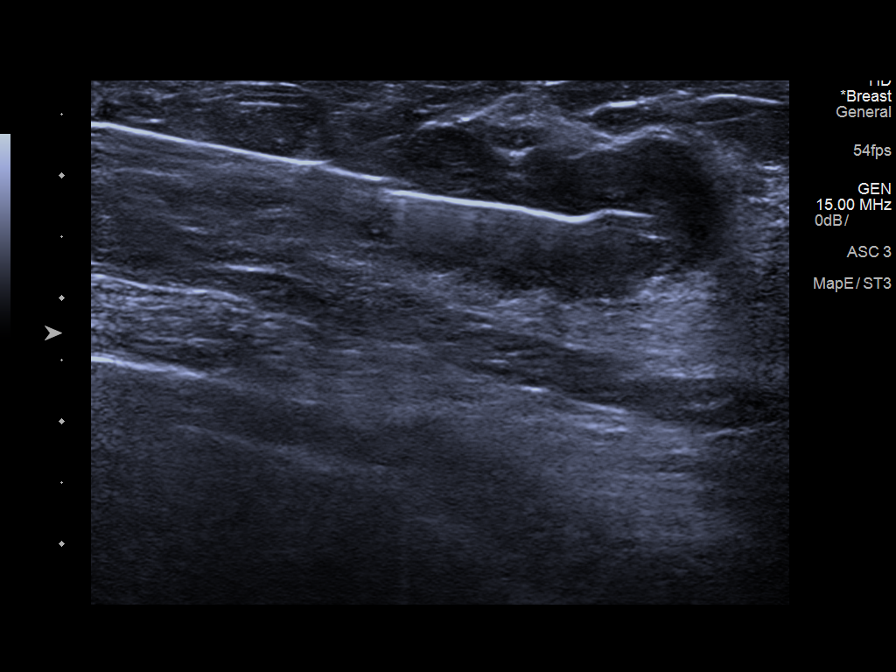
[im 3/7]
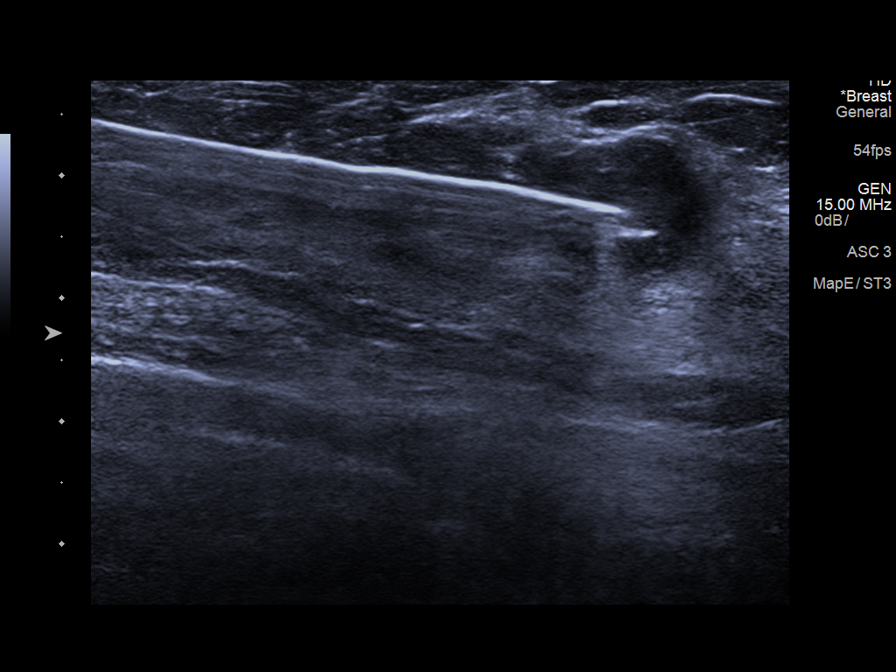
[im 4/7]
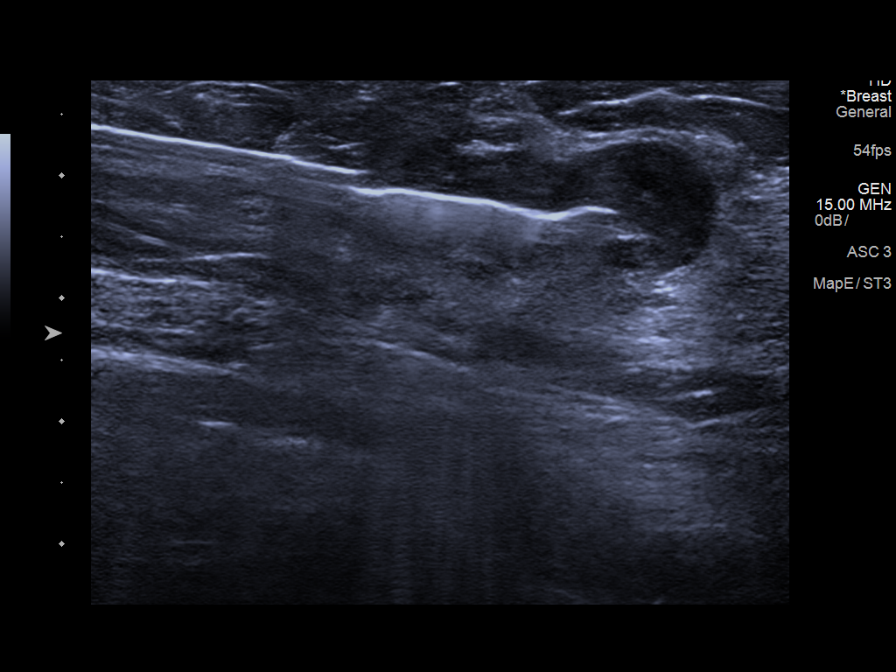
[im 5/7]
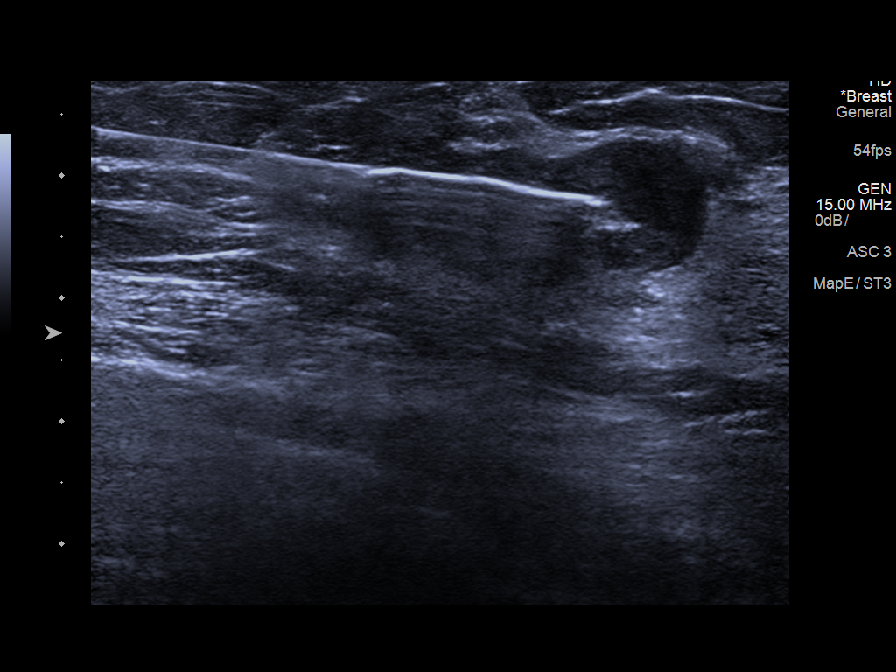
[im 6/7]
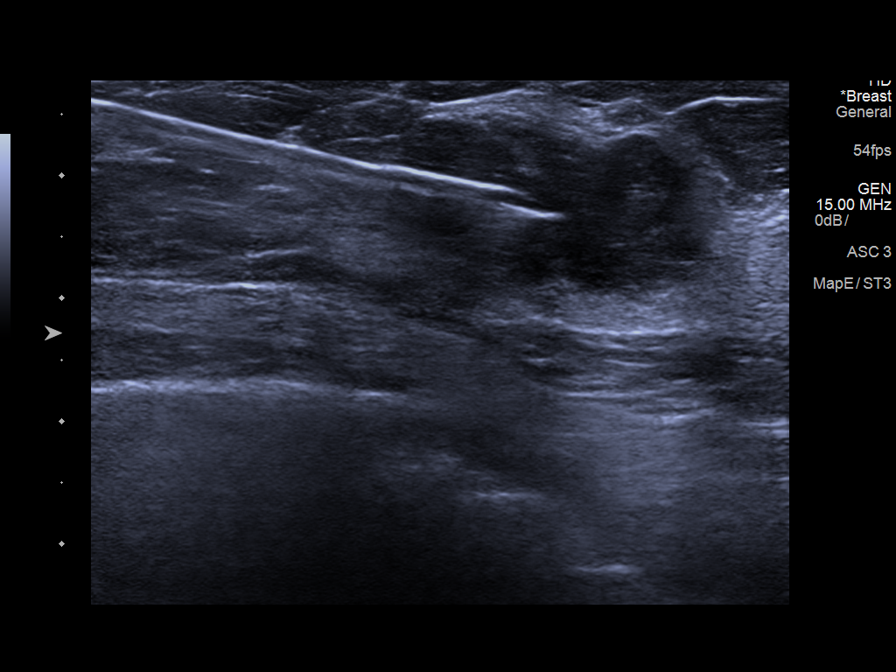
[im 7/7]
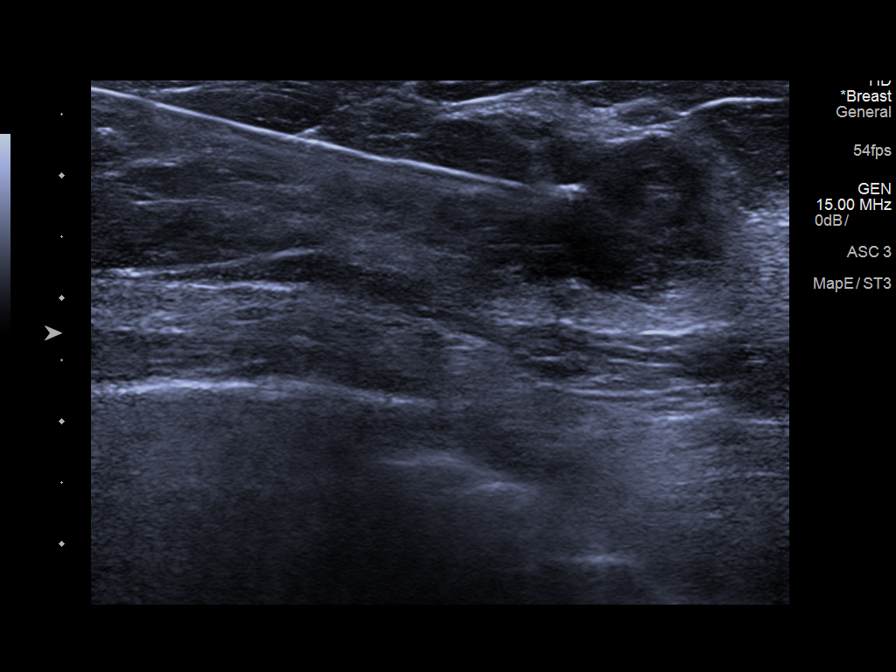

[7 of 7 positions shown; findings below may reference images not displayed]



Lesion quadrant: Upper-outer

Using sterile technique and 1% Lidocaine as local anesthetic, under
direct ultrasound visualization, a 14 gauge MATAPI device was
used to perform biopsy of the mass in the right breast at the 10
o'clock position using a lateral to medial approach. At the
conclusion of the procedure a ribbon shaped tissue marker clip was
deployed into the biopsy cavity. Follow up 2 view mammogram was
performed and dictated separately.
IMPRESSION: Ultrasound guided biopsy of the mass in the right breast at the 10
o'clock position. No apparent complications.

ADDENDUM:
Pathology of the RIGHT breast biopsy revealed Breast, right, needle
core biopsy, right breast 10 o'clock FIBROADENOMA. NO MALIGNANCY
IDENTIFIED.

This was found to be concordant by Dr. MATAPI.

Recommendation: Surgical excision due to growth.

At the patient's request, results and recommendations were relayed
to the patient by phone by MATAPI on [DATE] at [DATE]. The patient stated she did well following the biopsy with
soreness but no bleeding. Post biopsy instructions were reviewed
with the patient and all of her questions were answered. She was
encouraged to contact the imaging department of [HOSPITAL]
with any further questions or concerns.

Addendum by MATAPI on [DATE].

*** End of Addendum ***

## 2018-09-10 ENCOUNTER — Ambulatory Visit: Payer: Self-pay | Admitting: Surgery

## 2018-09-10 NOTE — H&P (Signed)
History of Present Illness Christina Brown. Christina Hinds MD; 09/10/2018 11:48 AM) The patient is a 47 year old female who presents with a subcutaneous abscess. Referred by Delphina Cahill, MD for infected cyst posterior neck  This is a 47 year old female who presents with an 18 month history of a palpable cyst on her posterior neck. She has had previous cysts on other parts of her body. A couple of weeks ago this area became swollen and very tender. It appeared red. She was seen by the nurse practitioner at her PCP. An I and D was attempted. Some purulent fluid was encountered. This was fairly traumatic for the patient. She has completed a course of Keflex. The drainage has stopped but she still has a mass in this area. She would like to have this addressed so it does not recur.   Past Surgical History Emeline Gins, [place]; 09/10/2018 9:47 AM) Cesarean Section - Multiple Oral Surgery  Diagnostic Studies History Emeline Gins, [place]; 09/10/2018 9:47 AM) Colonoscopy never Mammogram within last year Pap Smear 1-5 years ago  Allergies Emeline Gins, [place]; 09/10/2018 9:48 AM) No Known Drug Allergies [09/10/2018]: Allergies Reconciled  Medication History Emeline Gins, CMA; 09/10/2018 9:48 AM) Amoxicillin (500MG  Capsule, Oral) Active. Atenolol (25MG  Tablet, Oral) Active. methIMAzole (10MG  Tablet, Oral) Active. Medications Reconciled  Social History Emeline Gins, [place]; 09/10/2018 9:47 AM) Alcohol use Occasional alcohol use. Caffeine use Carbonated beverages, Coffee, Tea. No drug use Tobacco use Former smoker.  Family History Emeline Gins, [place]; 09/10/2018 9:47 AM) Diabetes Mellitus Mother, Sister. Heart Disease Mother, Sister. Heart disease in female family member before age 67 Kidney Disease Mother. Melanoma Father. Respiratory Condition Sister.  Pregnancy / Birth History Emeline Gins, [place]; 09/10/2018 9:47 AM) Age at menarche 69 years. Contraceptive  History Depo-provera, Oral contraceptives. Gravida 4 Maternal age 58-20 Para 4 Regular periods  Other Problems Emeline Gins, CMA; 09/10/2018 9:47 AM) Anxiety Disorder Back Pain Depression Gastroesophageal Reflux Disease Lump In Breast Thyroid Disease     Review of Systems Emeline Gins CMA; 09/10/2018 9:47 AM) General Present- Fatigue and Weight Gain. Not Present- Appetite Loss, Chills, Fever, Night Sweats and Weight Loss. Skin Present- New Lesions. Not Present- Change in Wart/Mole, Dryness, Hives, Jaundice, Non-Healing Wounds, Rash and Ulcer. HEENT Present- Earache and Sore Throat. Not Present- Hearing Loss, Hoarseness, Nose Bleed, Oral Ulcers, Ringing in the Ears, Seasonal Allergies, Sinus Pain, Visual Disturbances, Wears glasses/contact lenses and Yellow Eyes. Respiratory Not Present- Bloody sputum, Chronic Cough, Difficulty Breathing, Snoring and Wheezing. Breast Present- Breast Pain. Not Present- Breast Mass, Nipple Discharge and Skin Changes. Cardiovascular Not Present- Chest Pain, Difficulty Breathing Lying Down, Leg Cramps, Palpitations, Rapid Heart Rate, Shortness of Breath and Swelling of Extremities. Gastrointestinal Not Present- Abdominal Pain, Bloating, Bloody Stool, Change in Bowel Habits, Chronic diarrhea, Constipation, Difficulty Swallowing, Excessive gas, Gets full quickly at meals, Hemorrhoids, Indigestion, Nausea, Rectal Pain and Vomiting. Female Genitourinary Not Present- Frequency, Nocturia, Painful Urination, Pelvic Pain and Urgency. Musculoskeletal Not Present- Back Pain, Joint Pain, Joint Stiffness, Muscle Pain, Muscle Weakness and Swelling of Extremities. Neurological Not Present- Decreased Memory, Fainting, Headaches, Numbness, Seizures, Tingling, Tremor, Trouble walking and Weakness. Psychiatric Not Present- Anxiety, Bipolar, Change in Sleep Pattern, Depression, Fearful and Frequent crying. Endocrine Present- Hair Changes. Not Present- Cold  Intolerance, Excessive Hunger, Heat Intolerance, Hot flashes and New Diabetes. Hematology Present- Persistent Infections. Not Present- Blood Thinners, Easy Bruising, Excessive bleeding, Gland problems and HIV.  Vitals Emeline Gins CMA; 09/10/2018 9:48 AM) 09/10/2018 9:47 AM Weight: 180.38 lb Height: 66in Body  Surface Area: 1.91 m Body Mass Index: 29.11 kg/m  Temp.: 40F  Pulse: 66 (Regular)  BP: 108/68 (Sitting, Left Arm, Standard)      Physical Exam Rodman Key K. Avelardo Reesman MD; 09/10/2018 11:49 AM)  The physical exam findings are as follows: Note:WDWN in NAD Eyes: Pupils equal, round; sclera anicteric HENT: Oral mucosa moist; good dentition Neck: Right posterior - 2 cm protruding sebaceous cyst, healed incision; no erythema; no drainage; no thyromegaly Lungs: CTA bilaterally; normal respiratory effort CV: Regular rate and rhythm; no murmurs; extremities well-perfused with no edema Abd: +bowel sounds, soft, non-tender, no palpable organomegaly; no palpable hernias Skin: Warm, dry; no sign of jaundice Psychiatric - alert and oriented x 4; calm mood and affect    Assessment & Plan Rodman Key K. Delise Simenson MD; 09/10/2018 11:48 AM)  RUPTURED SEBACEOUS CYST (L72.0) Impression: Right posterior neck 2 cm  Current Plans Schedule for Surgery - Excision of sebaceous cyst - right posterior neck. The surgical procedure has been discussed with the patient. Potential risks, benefits, alternative treatments, and expected outcomes have been explained. All of the patient's questions at this time have been answered. The likelihood of reaching the patient's treatment goal is good. The patient understand the proposed surgical procedure and wishes to proceed.  Christina Brown. Georgette Dover, MD, [facility]  General/ Trauma Surgery Beeper [phone]  09/10/2018 11:49 AM

## 2018-09-30 DIAGNOSIS — L723 Sebaceous cyst: Secondary | ICD-10-CM

## 2018-09-30 HISTORY — DX: Sebaceous cyst: L72.3

## 2018-10-03 ENCOUNTER — Encounter (HOSPITAL_BASED_OUTPATIENT_CLINIC_OR_DEPARTMENT_OTHER): Payer: Self-pay

## 2018-10-03 ENCOUNTER — Other Ambulatory Visit: Payer: Self-pay

## 2018-10-11 ENCOUNTER — Encounter (HOSPITAL_BASED_OUTPATIENT_CLINIC_OR_DEPARTMENT_OTHER): Payer: Self-pay | Admitting: *Deleted

## 2018-10-11 ENCOUNTER — Ambulatory Visit (HOSPITAL_BASED_OUTPATIENT_CLINIC_OR_DEPARTMENT_OTHER): Payer: BC Managed Care – PPO | Admitting: Anesthesiology

## 2018-10-11 ENCOUNTER — Other Ambulatory Visit: Payer: Self-pay

## 2018-10-11 ENCOUNTER — Ambulatory Visit (HOSPITAL_BASED_OUTPATIENT_CLINIC_OR_DEPARTMENT_OTHER)
Admission: RE | Admit: 2018-10-11 | Discharge: 2018-10-11 | Disposition: A | Discharge status: Home / Self Care | Payer: BC Managed Care – PPO | Source: Ambulatory Visit | Attending: Surgery | Admitting: Surgery

## 2018-10-11 ENCOUNTER — Encounter (HOSPITAL_BASED_OUTPATIENT_CLINIC_OR_DEPARTMENT_OTHER)
Admission: RE | Disposition: A | Discharge status: Home / Self Care | Payer: Self-pay | Source: Ambulatory Visit | Attending: Surgery

## 2018-10-11 DIAGNOSIS — E0590 Thyrotoxicosis, unspecified without thyrotoxic crisis or storm: Secondary | ICD-10-CM | POA: Insufficient documentation

## 2018-10-11 DIAGNOSIS — L723 Sebaceous cyst: Secondary | ICD-10-CM | POA: Diagnosis present

## 2018-10-11 DIAGNOSIS — Z87891 Personal history of nicotine dependence: Secondary | ICD-10-CM | POA: Diagnosis not present

## 2018-10-11 HISTORY — PX: MASS EXCISION: SHX2000

## 2018-10-11 HISTORY — DX: Gastro-esophageal reflux disease without esophagitis: K21.9

## 2018-10-11 HISTORY — DX: Anxiety disorder, unspecified: F41.9

## 2018-10-11 HISTORY — DX: Sebaceous cyst: L72.3

## 2018-10-11 HISTORY — DX: Major depressive disorder, single episode, unspecified: F32.9

## 2018-10-11 HISTORY — DX: Depression, unspecified: F32.A

## 2018-10-11 HISTORY — DX: Thyrotoxicosis, unspecified without thyrotoxic crisis or storm: E05.90

## 2018-10-11 SURGERY — EXCISION MASS
Anesthesia: General | Site: Neck | Laterality: Right

## 2018-10-11 MED ORDER — CHLORHEXIDINE GLUCONATE CLOTH 2 % EX PADS: 6.0000 | MEDICATED_PAD | Freq: Once | CUTANEOUS | Status: DC

## 2018-10-11 MED ORDER — DEXAMETHASONE SODIUM PHOSPHATE 10 MG/ML IJ SOLN
INTRAMUSCULAR | Status: DC | PRN
  Administered 2018-10-11: 10 mg via INTRAVENOUS

## 2018-10-11 MED ORDER — ONDANSETRON HCL 4 MG/2ML IJ SOLN: 4.0000 mg | Freq: Four times a day (QID) | INTRAMUSCULAR | Status: DC | PRN

## 2018-10-11 MED ORDER — ROCURONIUM BROMIDE 100 MG/10ML IV SOLN
INTRAVENOUS | Status: DC | PRN
  Administered 2018-10-11: 40 mg via INTRAVENOUS

## 2018-10-11 MED ORDER — LACTATED RINGERS IV SOLN
INTRAVENOUS | Status: DC
  Administered 2018-10-11: 10 mL/h via INTRAVENOUS

## 2018-10-11 MED ORDER — FENTANYL CITRATE (PF) 100 MCG/2ML IJ SOLN: 50.0000 ug | INTRAMUSCULAR | Status: DC | PRN

## 2018-10-11 MED ORDER — KETOROLAC TROMETHAMINE 30 MG/ML IJ SOLN
INTRAMUSCULAR | Status: AC
  Filled 2018-10-11: qty 1

## 2018-10-11 MED ORDER — OXYCODONE HCL 5 MG/5ML PO SOLN: 5.0000 mg | Freq: Once | ORAL | Status: DC | PRN

## 2018-10-11 MED ORDER — SUGAMMADEX SODIUM 200 MG/2ML IV SOLN
INTRAVENOUS | Status: AC
  Filled 2018-10-11: qty 2

## 2018-10-11 MED ORDER — SCOPOLAMINE 1 MG/3DAYS TD PT72: 1.0000 | MEDICATED_PATCH | Freq: Once | TRANSDERMAL | Status: DC | PRN

## 2018-10-11 MED ORDER — OXYCODONE HCL 5 MG PO TABS: 5.0000 mg | ORAL_TABLET | Freq: Once | ORAL | Status: DC | PRN

## 2018-10-11 MED ORDER — KETOROLAC TROMETHAMINE 30 MG/ML IJ SOLN
INTRAMUSCULAR | Status: DC | PRN
  Administered 2018-10-11: 30 mg via INTRAVENOUS

## 2018-10-11 MED ORDER — BUPIVACAINE-EPINEPHRINE 0.25% -1:200000 IJ SOLN
INTRAMUSCULAR | Status: DC | PRN
  Administered 2018-10-11: 10 mL

## 2018-10-11 MED ORDER — ONDANSETRON HCL 4 MG/2ML IJ SOLN
INTRAMUSCULAR | Status: AC
  Filled 2018-10-11: qty 2

## 2018-10-11 MED ORDER — MIDAZOLAM HCL 2 MG/2ML IJ SOLN
INTRAMUSCULAR | Status: AC
  Filled 2018-10-11: qty 2

## 2018-10-11 MED ORDER — ROCURONIUM BROMIDE 50 MG/5ML IV SOSY
PREFILLED_SYRINGE | INTRAVENOUS | Status: AC
  Filled 2018-10-11: qty 5

## 2018-10-11 MED ORDER — FENTANYL CITRATE (PF) 100 MCG/2ML IJ SOLN
INTRAMUSCULAR | Status: DC | PRN
  Administered 2018-10-11 (×2): 50 ug via INTRAVENOUS

## 2018-10-11 MED ORDER — FENTANYL CITRATE (PF) 100 MCG/2ML IJ SOLN: 25.0000 ug | INTRAMUSCULAR | Status: DC | PRN

## 2018-10-11 MED ORDER — MIDAZOLAM HCL 5 MG/5ML IJ SOLN
INTRAMUSCULAR | Status: DC | PRN
  Administered 2018-10-11: 2 mg via INTRAVENOUS

## 2018-10-11 MED ORDER — SUGAMMADEX SODIUM 200 MG/2ML IV SOLN
INTRAVENOUS | Status: DC | PRN
  Administered 2018-10-11: 200 mg via INTRAVENOUS

## 2018-10-11 MED ORDER — DEXAMETHASONE SODIUM PHOSPHATE 10 MG/ML IJ SOLN
INTRAMUSCULAR | Status: AC
  Filled 2018-10-11: qty 1

## 2018-10-11 MED ORDER — ONDANSETRON HCL 4 MG/2ML IJ SOLN
INTRAMUSCULAR | Status: DC | PRN
  Administered 2018-10-11: 4 mg via INTRAVENOUS

## 2018-10-11 MED ORDER — LIDOCAINE 2% (20 MG/ML) 5 ML SYRINGE
INTRAMUSCULAR | Status: DC | PRN
  Administered 2018-10-11: 60 mg via INTRAVENOUS

## 2018-10-11 MED ORDER — MIDAZOLAM HCL 2 MG/2ML IJ SOLN: 1.0000 mg | INTRAMUSCULAR | Status: DC | PRN

## 2018-10-11 MED ORDER — FENTANYL CITRATE (PF) 100 MCG/2ML IJ SOLN
INTRAMUSCULAR | Status: AC
  Filled 2018-10-11: qty 2

## 2018-10-11 MED ORDER — CEFAZOLIN SODIUM-DEXTROSE 2-4 GM/100ML-% IV SOLN
2.0000 g | INTRAVENOUS | Status: AC
  Administered 2018-10-11: 2 g via INTRAVENOUS

## 2018-10-11 MED ORDER — CEFAZOLIN SODIUM-DEXTROSE 2-4 GM/100ML-% IV SOLN
INTRAVENOUS | Status: AC
  Filled 2018-10-11: qty 100

## 2018-10-11 MED ORDER — PROPOFOL 10 MG/ML IV BOLUS
INTRAVENOUS | Status: DC | PRN
  Administered 2018-10-11: 200 mg via INTRAVENOUS

## 2018-10-11 SURGICAL SUPPLY — 44 items
BENZOIN TINCTURE PRP APPL 2/3 (GAUZE/BANDAGES/DRESSINGS) ×2 IMPLANT
BLADE CLIPPER SURG (BLADE) IMPLANT
BLADE SURG 15 STRL LF DISP TIS (BLADE) ×1 IMPLANT
BLADE SURG 15 STRL SS (BLADE) ×1
CANISTER SUCT 1200ML W/VALVE (MISCELLANEOUS) IMPLANT
CHLORAPREP W/TINT 26ML (MISCELLANEOUS) ×2 IMPLANT
COVER BACK TABLE 60X90IN (DRAPES) ×2 IMPLANT
COVER MAYO STAND STRL (DRAPES) ×2 IMPLANT
COVER WAND RF STERILE (DRAPES) IMPLANT
DECANTER SPIKE VIAL GLASS SM (MISCELLANEOUS) IMPLANT
DRAPE LAPAROTOMY 100X72 PEDS (DRAPES) ×2 IMPLANT
DRAPE UTILITY XL STRL (DRAPES) ×2 IMPLANT
DRSG TEGADERM 2-3/8X2-3/4 SM (GAUZE/BANDAGES/DRESSINGS) IMPLANT
DRSG TEGADERM 4X4.75 (GAUZE/BANDAGES/DRESSINGS) IMPLANT
ELECT COATED BLADE 2.86 ST (ELECTRODE) ×2 IMPLANT
ELECT REM PT RETURN 9FT ADLT (ELECTROSURGICAL) ×2
ELECTRODE REM PT RTRN 9FT ADLT (ELECTROSURGICAL) ×1 IMPLANT
GAUZE SPONGE 4X4 12PLY STRL LF (GAUZE/BANDAGES/DRESSINGS) IMPLANT
GLOVE BIO SURGEON STRL SZ 6.5 (GLOVE) ×2 IMPLANT
GLOVE BIO SURGEON STRL SZ7 (GLOVE) ×2 IMPLANT
GLOVE BIOGEL PI IND STRL 7.0 (GLOVE) ×1 IMPLANT
GLOVE BIOGEL PI IND STRL 7.5 (GLOVE) ×1 IMPLANT
GLOVE BIOGEL PI INDICATOR 7.0 (GLOVE) ×1
GLOVE BIOGEL PI INDICATOR 7.5 (GLOVE) ×1
GOWN STRL REUS W/ TWL LRG LVL3 (GOWN DISPOSABLE) ×2 IMPLANT
GOWN STRL REUS W/TWL LRG LVL3 (GOWN DISPOSABLE) ×2
NEEDLE HYPO 25X1 1.5 SAFETY (NEEDLE) ×2 IMPLANT
NS IRRIG 1000ML POUR BTL (IV SOLUTION) IMPLANT
PACK BASIN DAY SURGERY FS (CUSTOM PROCEDURE TRAY) ×2 IMPLANT
PENCIL BUTTON HOLSTER BLD 10FT (ELECTRODE) ×2 IMPLANT
SLEEVE SCD COMPRESS KNEE MED (MISCELLANEOUS) ×2 IMPLANT
SPONGE GAUZE 2X2 8PLY STRL LF (GAUZE/BANDAGES/DRESSINGS) IMPLANT
STRIP CLOSURE SKIN 1/2X4 (GAUZE/BANDAGES/DRESSINGS) ×2 IMPLANT
SUT MON AB 4-0 PC3 18 (SUTURE) IMPLANT
SUT PROLENE 6 0 P 1 18 (SUTURE) IMPLANT
SUT SILK 2 0 PERMA HAND 18 BK (SUTURE) IMPLANT
SUT VIC AB 3-0 SH 27 (SUTURE) ×1
SUT VIC AB 3-0 SH 27X BRD (SUTURE) ×1 IMPLANT
SYR BULB 3OZ (MISCELLANEOUS) ×2 IMPLANT
SYR CONTROL 10ML LL (SYRINGE) ×2 IMPLANT
TOWEL GREEN STERILE FF (TOWEL DISPOSABLE) ×2 IMPLANT
TOWEL OR NON WOVEN STRL DISP B (DISPOSABLE) ×2 IMPLANT
TUBE CONNECTING 20X1/4 (TUBING) IMPLANT
YANKAUER SUCT BULB TIP NO VENT (SUCTIONS) IMPLANT

## 2018-10-11 NOTE — Anesthesia Postprocedure Evaluation (Signed)
Anesthesia Post Note  Patient: Christina Brown  Procedure(s) Performed: EXCISION OF SEBACEOUS CYST - RIGHT POSTERIOR NECK (Right Neck)     Patient location during evaluation: PACU Anesthesia Type: General Level of consciousness: awake and alert Pain management: pain level controlled Vital Signs Assessment: post-procedure vital signs reviewed and stable Respiratory status: spontaneous breathing, nonlabored ventilation, respiratory function stable and patient connected to nasal cannula oxygen Cardiovascular status: blood pressure returned to baseline and stable Postop Assessment: no apparent nausea or vomiting Anesthetic complications: no    Last Vitals:  Vitals:   10/11/18 1015 10/11/18 1045  BP: 121/75 137/84  Pulse: (!) 58 64  Resp: 14 18  Temp:  36.6 C  SpO2: 98% 98%    Last Pain:  Vitals:   10/11/18 1045  TempSrc:   PainSc: 0-No pain                 Claudia Alvizo S

## 2018-10-11 NOTE — Anesthesia Procedure Notes (Signed)
Procedure Name: Intubation Date/Time: 10/11/2018 9:13 AM Performed by: Gwyndolyn Saxon, CRNA Pre-anesthesia Checklist: Patient identified, Emergency Drugs available, Suction available and Patient being monitored Patient Re-evaluated:Patient Re-evaluated prior to induction Oxygen Delivery Method: Circle system utilized Preoxygenation: Pre-oxygenation with 100% oxygen Induction Type: IV induction Ventilation: Mask ventilation without difficulty Laryngoscope Size: Miller and 2 Grade View: Grade I Tube type: Oral Tube size: 7.0 mm Number of attempts: 1 Airway Equipment and Method: Stylet Placement Confirmation: ETT inserted through vocal cords under direct vision,  positive ETCO2 and breath sounds checked- equal and bilateral Secured at: 20 cm Tube secured with: Tape Dental Injury: Teeth and Oropharynx as per pre-operative assessment

## 2018-10-11 NOTE — Transfer of Care (Signed)
Immediate Anesthesia Transfer of Care Note  Patient: Christina Brown  Procedure(s) Performed: EXCISION OF SEBACEOUS CYST - RIGHT POSTERIOR NECK (Right Neck)  Patient Location: PACU  Anesthesia Type:General  Level of Consciousness: drowsy and patient cooperative  Airway & Oxygen Therapy: Patient Spontanous Breathing and Patient connected to face mask oxygen  Post-op Assessment: Report given to RN and Post -op Vital signs reviewed and stable  Post vital signs: Reviewed and stable  Last Vitals:  Vitals Value Taken Time  BP 122/76 10/11/2018  9:50 AM  Temp    Pulse 71 10/11/2018  9:53 AM  Resp 17 10/11/2018  9:53 AM  SpO2 100 % 10/11/2018  9:53 AM  Vitals shown include unvalidated device data.  Last Pain:  Vitals:   10/11/18 0744  TempSrc: Oral  PainSc: 0-No pain      Patients Stated Pain Goal: 0 ([id number])  Complications: No apparent anesthesia complications

## 2018-10-11 NOTE — Op Note (Signed)
Preop diagnosis: Ruptured sebaceous cyst right posterior neck Postop diagnosis: Same (2 cm) Procedure performed: Excision of sebaceous cyst right posterior neck Surgeon:Jah Alarid K Pasqualina Colasurdo Anesthesia: General Indications: This is a 47 year old female who presented with an infected sebaceous cyst of the right posterior neck several weeks ago.  This was drained and the infection has healed.  However she has a persistent mass in this area.  She would like to have the cyst completely removed to prevent a recurrence of the infection.  Description of procedure: The patient is brought to the operating room and placed in the supine position on the operating room table.  After an adequate level of general anesthesia was obtained, she was moved to a lateral position on her left side.  Her right neck was prepped with ChloraPrep and draped in sterile fashion.  A timeout was taken to ensure the proper patient and proper procedure.  We made an elliptical incision over the area of the previous scar.  We dissected down in the subcutaneous tissues with cautery.  We excised the entire cyst back to healthy-appearing adipose tissue.  We inspected for hemostasis.  We infiltrated 0.25% Marcaine with epinephrine.  The wound was closed with a deep layer of 3-0 Vicryl and a subcuticular layer 4-0 Monocryl.  Benzoin Steri-Strips were applied.  The patient was then extubated and brought to the recovery room in stable condition.  All sponge, instrument, and needle counts are correct.  Imogene Burn. Georgette Dover, MD, [facility]  General/ Trauma Surgery Beeper [phone]  10/11/2018 9:45 AM

## 2018-10-11 NOTE — H&P (Signed)
History of Present Illness  The patient is a 47 year old female who presents with a subcutaneous abscess. Referred by Delphina Cahill, MD for infected cyst posterior neck  This is a 47 year old female who presents with an 18 month history of a palpable cyst on her posterior neck. She has had previous cysts on other parts of her body. A couple of weeks ago this area became swollen and very tender. It appeared red. She was seen by the nurse practitioner at her PCP. An I and D was attempted. Some purulent fluid was encountered. This was fairly traumatic for the patient. She has completed a course of Keflex. The drainage has stopped but she still has a mass in this area. She would like to have this addressed so it does not recur.   Past Surgical History  Cesarean Section - Multiple Oral Surgery  Diagnostic Studies History  Colonoscopy never Mammogram within last year Pap Smear 1-5 years ago  Allergies  No Known Drug Allergies [09/10/2018]: Allergies Reconciled  Medication History  Amoxicillin (500MG  Capsule, Oral) Active. Atenolol (25MG  Tablet, Oral) Active. methIMAzole (10MG  Tablet, Oral) Active. Medications Reconciled  Social History  Alcohol use Occasional alcohol use. Caffeine use Carbonated beverages, Coffee, Tea. No drug use Tobacco use Former smoker.  Family History  Diabetes Mellitus Mother, Sister. Heart Disease Mother, Sister. Heart disease in female family member before age 20 Kidney Disease Mother. Melanoma Father. Respiratory Condition Sister.  Pregnancy / Birth History  Age at menarche 54 years. Contraceptive History Depo-provera, Oral contraceptives. Gravida 4 Maternal age 39-20 Para 4 Regular periods  Other Problems Anxiety Disorder Back Pain Depression Gastroesophageal Reflux Disease Lump In Breast Thyroid Disease     Review of Systems  General Present- Fatigue and Weight Gain. Not Present-  Appetite Loss, Chills, Fever, Night Sweats and Weight Loss. Skin Present- New Lesions. Not Present- Change in Wart/Mole, Dryness, Hives, Jaundice, Non-Healing Wounds, Rash and Ulcer. HEENT Present- Earache and Sore Throat. Not Present- Hearing Loss, Hoarseness, Nose Bleed, Oral Ulcers, Ringing in the Ears, Seasonal Allergies, Sinus Pain, Visual Disturbances, Wears glasses/contact lenses and Yellow Eyes. Respiratory Not Present- Bloody sputum, Chronic Cough, Difficulty Breathing, Snoring and Wheezing. Breast Present- Breast Pain. Not Present- Breast Mass, Nipple Discharge and Skin Changes. Cardiovascular Not Present- Chest Pain, Difficulty Breathing Lying Down, Leg Cramps, Palpitations, Rapid Heart Rate, Shortness of Breath and Swelling of Extremities. Gastrointestinal Not Present- Abdominal Pain, Bloating, Bloody Stool, Change in Bowel Habits, Chronic diarrhea, Constipation, Difficulty Swallowing, Excessive gas, Gets full quickly at meals, Hemorrhoids, Indigestion, Nausea, Rectal Pain and Vomiting. Female Genitourinary Not Present- Frequency, Nocturia, Painful Urination, Pelvic Pain and Urgency. Musculoskeletal Not Present- Back Pain, Joint Pain, Joint Stiffness, Muscle Pain, Muscle Weakness and Swelling of Extremities. Neurological Not Present- Decreased Memory, Fainting, Headaches, Numbness, Seizures, Tingling, Tremor, Trouble walking and Weakness. Psychiatric Not Present- Anxiety, Bipolar, Change in Sleep Pattern, Depression, Fearful and Frequent crying. Endocrine Present- Hair Changes. Not Present- Cold Intolerance, Excessive Hunger, Heat Intolerance, Hot flashes and New Diabetes. Hematology Present- Persistent Infections. Not Present- Blood Thinners, Easy Bruising, Excessive bleeding, Gland problems and HIV.  Vitals  Weight: 180.38 lb Height: 66in Body Surface Area: 1.91 m Body Mass Index: 29.11 kg/m  Temp.: 38F  Pulse: 66 (Regular)  BP: 108/68 (Sitting, Left Arm,  Standard)      Physical Exam   The physical exam findings are as follows: Note:WDWN in NAD Eyes: Pupils equal, round; sclera anicteric HENT: Oral mucosa moist; good dentition Neck: Right posterior - 2 cm  protruding sebaceous cyst, healed incision; no erythema; no drainage; no thyromegaly Lungs: CTA bilaterally; normal respiratory effort CV: Regular rate and rhythm; no murmurs; extremities well-perfused with no edema Abd: +bowel sounds, soft, non-tender, no palpable organomegaly; no palpable hernias Skin: Warm, dry; no sign of jaundice Psychiatric - alert and oriented x 4; calm mood and affect    Assessment & Plan   RUPTURED SEBACEOUS CYST (L72.0) Impression: Right posterior neck 2 cm  Current Plans Schedule for Surgery - Excision of sebaceous cyst - right posterior neck. The surgical procedure has been discussed with the patient. Potential risks, benefits, alternative treatments, and expected outcomes have been explained. All of the patient's questions at this time have been answered. The likelihood of reaching the patient's treatment goal is good. The patient understand the proposed surgical procedure and wishes to proceed.  Imogene Burn. Georgette Dover, MD, [facility]  General/ Trauma Surgery Beeper [phone]  10/11/2018 8:25 AM

## 2018-10-11 NOTE — Anesthesia Preprocedure Evaluation (Signed)
Anesthesia Evaluation  Patient identified by MRN, date of birth, ID band Patient awake    Reviewed: Allergy & Precautions, H&P , NPO status , Patient's Chart, lab work & pertinent test results  Airway Mallampati: II   Neck ROM: full    Dental   Pulmonary former smoker,    breath sounds clear to auscultation       Cardiovascular negative cardio ROS   Rhythm:regular Rate:Normal     Neuro/Psych PSYCHIATRIC DISORDERS Anxiety Depression    GI/Hepatic GERD  ,  Endo/Other  Hyperthyroidism   Renal/GU      Musculoskeletal   Abdominal   Peds  Hematology   Anesthesia Other Findings   Reproductive/Obstetrics                             Anesthesia Physical Anesthesia Plan  ASA: II  Anesthesia Plan: General   Post-op Pain Management:    Induction: Intravenous  PONV Risk Score and Plan: 3 and Ondansetron, Dexamethasone, Midazolam and Treatment may vary due to age or medical condition  Airway Management Planned: Oral ETT  Additional Equipment:   Intra-op Plan:   Post-operative Plan: Extubation in OR  Informed Consent: I have reviewed the patients History and Physical, chart, labs and discussed the procedure including the risks, benefits and alternatives for the proposed anesthesia with the patient or authorized representative who has indicated his/her understanding and acceptance.     Plan Discussed with: CRNA, Anesthesiologist and Surgeon  Anesthesia Plan Comments:         Anesthesia Quick Evaluation

## 2018-10-11 NOTE — Discharge Instructions (Signed)
[place] Office Phone Number [phone]   POST OP INSTRUCTIONS  Always review your discharge instruction sheet given to you by the facility where your surgery was performed.  IF YOU HAVE DISABILITY OR FAMILY LEAVE FORMS, YOU MUST BRING THEM TO THE OFFICE FOR PROCESSING.  DO NOT GIVE THEM TO YOUR DOCTOR.  1. You may take acetaminophen (Tylenol) or ibuprofen (Advil) as needed. No ibuprofen until 3:30pm! 2. Take your usually prescribed medications unless otherwise directed 3. If you need a refill on your pain medication, please contact your pharmacy.  They will contact our office to request authorization.  Prescriptions will not be filled after 5pm or on week-ends. 4. You should eat very light the first 24 hours after surgery, such as soup, crackers, pudding, etc.  Resume your normal diet the day after surgery. 5. Most patients will experience some swelling and bruising around the surgical site.  Ice packs will help.  Swelling and bruising can take several days to resolve.  6. It is common to experience some constipation if taking pain medication after surgery.  Increasing fluid intake and taking a stool softener will usually help or prevent this problem from occurring.  A mild laxative (Milk of Magnesia or Miralax) should be taken according to package directions if there are no bowel movements after 48 hours. 7. You may remove your bandages 48 hours after surgery, and you may shower at that time.  You will have steri-strips (small skin tapes) in place directly over the incision.  These strips should be left on the skin for 7-10 days.   8. ACTIVITIES:  You may resume regular daily activities (gradually increasing) beginning the next day.   You may have sexual intercourse when it is comfortable. a. You may drive when you no longer are taking prescription pain medication, you can comfortably wear a seatbelt, and you can safely maneuver your car and apply brakes. b. RETURN TO WORK:   1-2 weeks 9. You should see your doctor in the office for a follow-up appointment approximately two to three weeks after your surgery.    WHEN TO CALL YOUR DOCTOR: 1. Fever over 101.0 2. Nausea and/or vomiting. 3. Extreme swelling or bruising. 4. Continued bleeding from incision. 5. Increased pain, redness, or drainage from the incision.  The clinic staff is available to answer your questions during regular business hours.  Please dont hesitate to call and ask to speak to one of the nurses for clinical concerns.  If you have a medical emergency, go to the nearest emergency room or call 911.  A surgeon from [facility] is always on call at the hospital.  For further questions, please visit [email or web address]     Post Anesthesia Home Care Instructions  Activity: Get plenty of rest for the remainder of the day. A responsible individual must stay with you for 24 hours following the procedure.  For the next 24 hours, DO NOT: -Drive a car -Paediatric nurse -Drink alcoholic beverages -Take any medication unless instructed by your physician -Make any legal decisions or sign important papers.  Meals: Start with liquid foods such as gelatin or soup. Progress to regular foods as tolerated. Avoid greasy, spicy, heavy foods. If nausea and/or vomiting occur, drink only clear liquids until the nausea and/or vomiting subsides. Call your physician if vomiting continues.  Special Instructions/Symptoms: Your throat may feel dry or sore from the anesthesia or the breathing tube placed in your throat during surgery. If this causes discomfort, gargle with warm  salt water. The discomfort should disappear within 24 hours.  If you had a scopolamine patch placed behind your ear for the management of post- operative nausea and/or vomiting:  1. The medication in the patch is effective for 72 hours, after which it should be removed.  Wrap patch in a tissue and discard in the trash. Wash  hands thoroughly with soap and water. 2. You may remove the patch earlier than 72 hours if you experience unpleasant side effects which may include dry mouth, dizziness or visual disturbances. 3. Avoid touching the patch. Wash your hands with soap and water after contact with the patch.

## 2018-10-12 ENCOUNTER — Encounter (HOSPITAL_BASED_OUTPATIENT_CLINIC_OR_DEPARTMENT_OTHER): Payer: Self-pay | Admitting: Surgery

## 2018-11-15 ENCOUNTER — Other Ambulatory Visit (HOSPITAL_COMMUNITY): Payer: Self-pay | Admitting: Internal Medicine

## 2018-11-15 DIAGNOSIS — R229 Localized swelling, mass and lump, unspecified: Principal | ICD-10-CM

## 2018-11-15 DIAGNOSIS — IMO0002 Reserved for concepts with insufficient information to code with codable children: Secondary | ICD-10-CM

## 2018-11-21 ENCOUNTER — Other Ambulatory Visit (HOSPITAL_COMMUNITY): Payer: Self-pay | Admitting: Internal Medicine

## 2018-11-21 DIAGNOSIS — IMO0002 Reserved for concepts with insufficient information to code with codable children: Secondary | ICD-10-CM

## 2018-11-21 DIAGNOSIS — R229 Localized swelling, mass and lump, unspecified: Principal | ICD-10-CM

## 2018-12-18 ENCOUNTER — Ambulatory Visit (HOSPITAL_COMMUNITY)
Admission: RE | Admit: 2018-12-18 | Discharge: 2018-12-18 | Disposition: A | Discharge status: Home / Self Care | Payer: BC Managed Care – PPO | Source: Ambulatory Visit | Attending: Internal Medicine | Admitting: Internal Medicine

## 2018-12-18 ENCOUNTER — Ambulatory Visit (HOSPITAL_COMMUNITY): Payer: BC Managed Care – PPO

## 2018-12-18 ENCOUNTER — Other Ambulatory Visit (HOSPITAL_COMMUNITY): Payer: Self-pay | Admitting: Internal Medicine

## 2018-12-18 DIAGNOSIS — R229 Localized swelling, mass and lump, unspecified: Secondary | ICD-10-CM | POA: Insufficient documentation

## 2018-12-18 DIAGNOSIS — IMO0002 Reserved for concepts with insufficient information to code with codable children: Secondary | ICD-10-CM

## 2018-12-18 IMAGING — MG DIGITAL DIAGNOSTIC BILATERAL MAMMOGRAM WITH TOMO AND CAD
6 of 10 series · 6 of 30 positions shown · non-contrast
Comparison: Previous exam(s).

CLINICAL DATA: Interval follow-up of a likely benign mass involving
the UPPER OUTER QUADRANT of the RIGHT breast. The patient had been
recommended for six-month follow-up and now returns at 14 months.
Patient has palpated a lump in this location for approximately 1
month. Annual evaluation, LEFT breast.

EXAM:
DIGITAL DIAGNOSTIC BILATERAL MAMMOGRAM WITH CAD AND TOMO
ULTRASOUND RIGHT BREAST

[R MLO synth-2D]
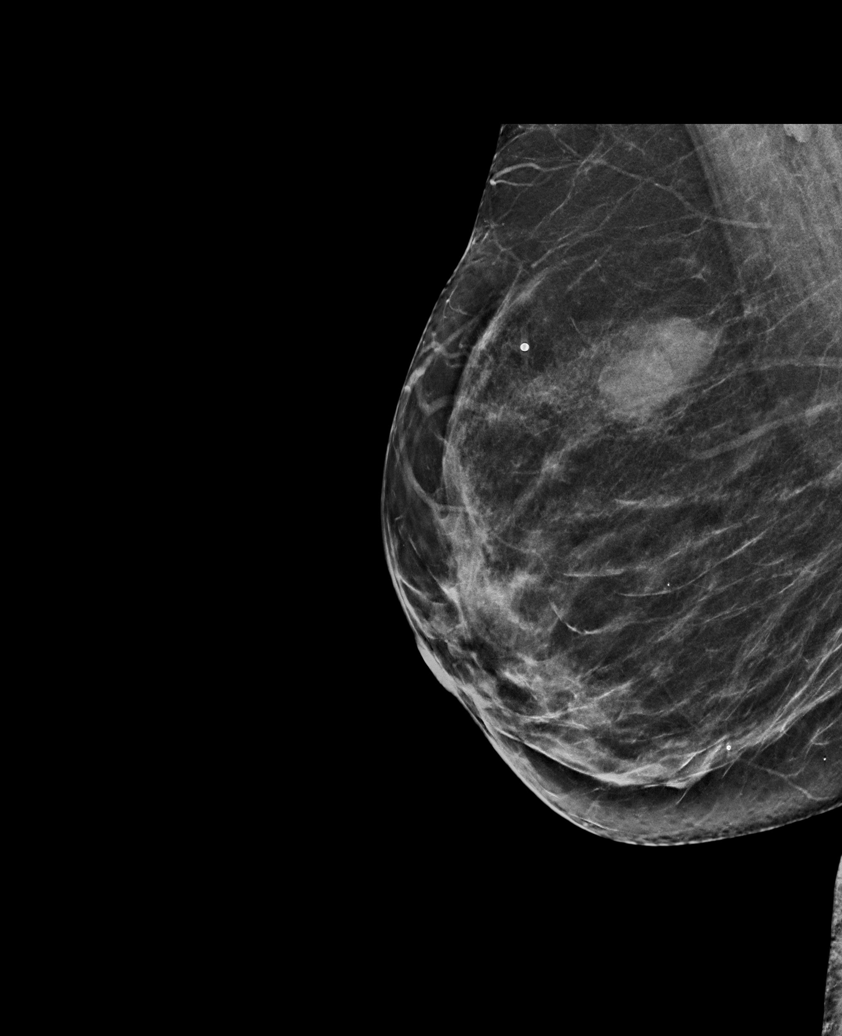

[R CC synth-2D (1 of 2)]
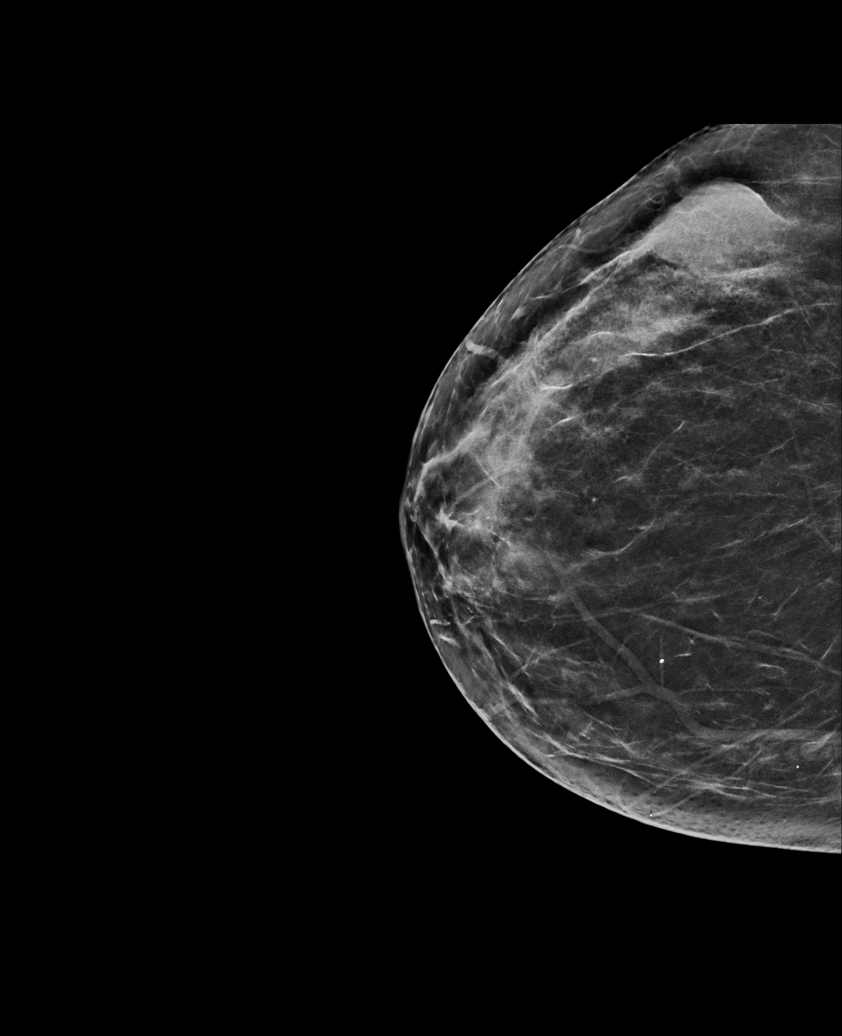

[R CC synth-2D (2 of 2)]
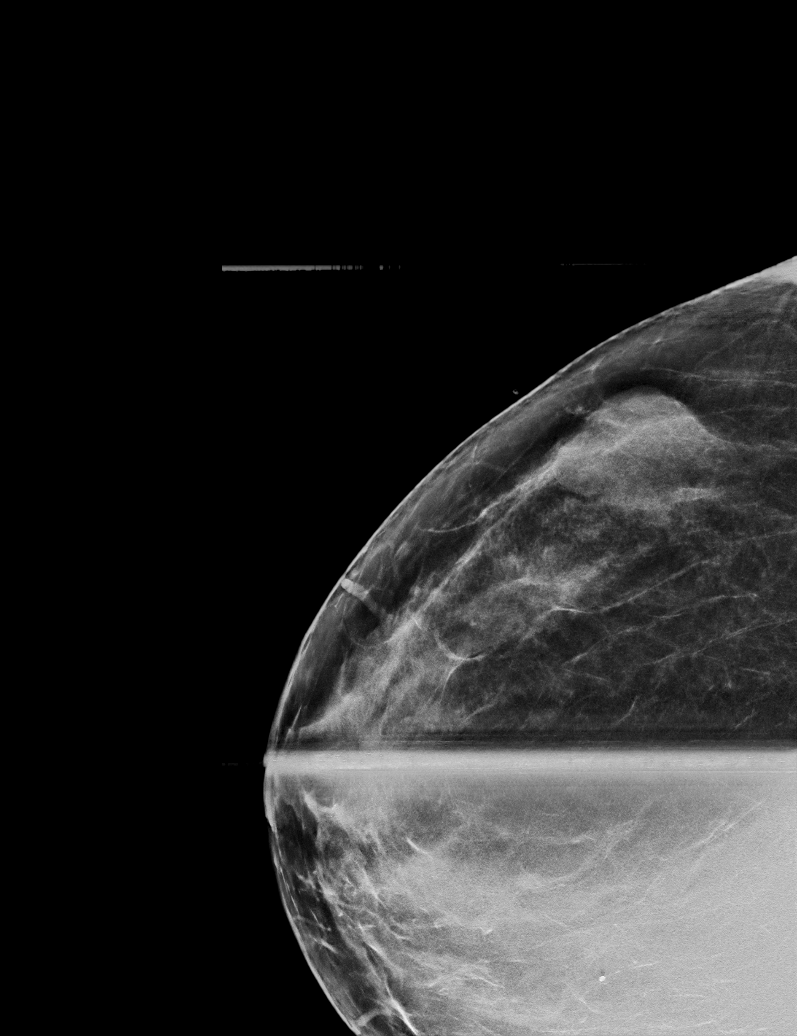

[L MLO synth-2D]
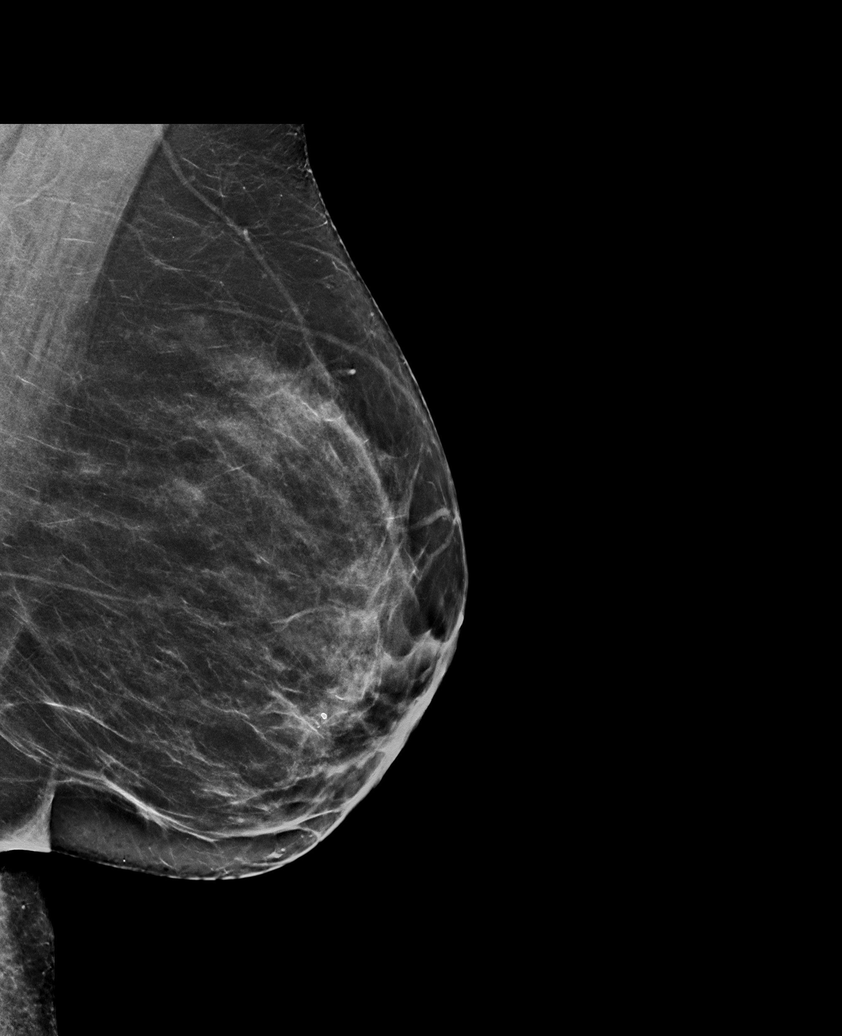

[L CC synth-2D]
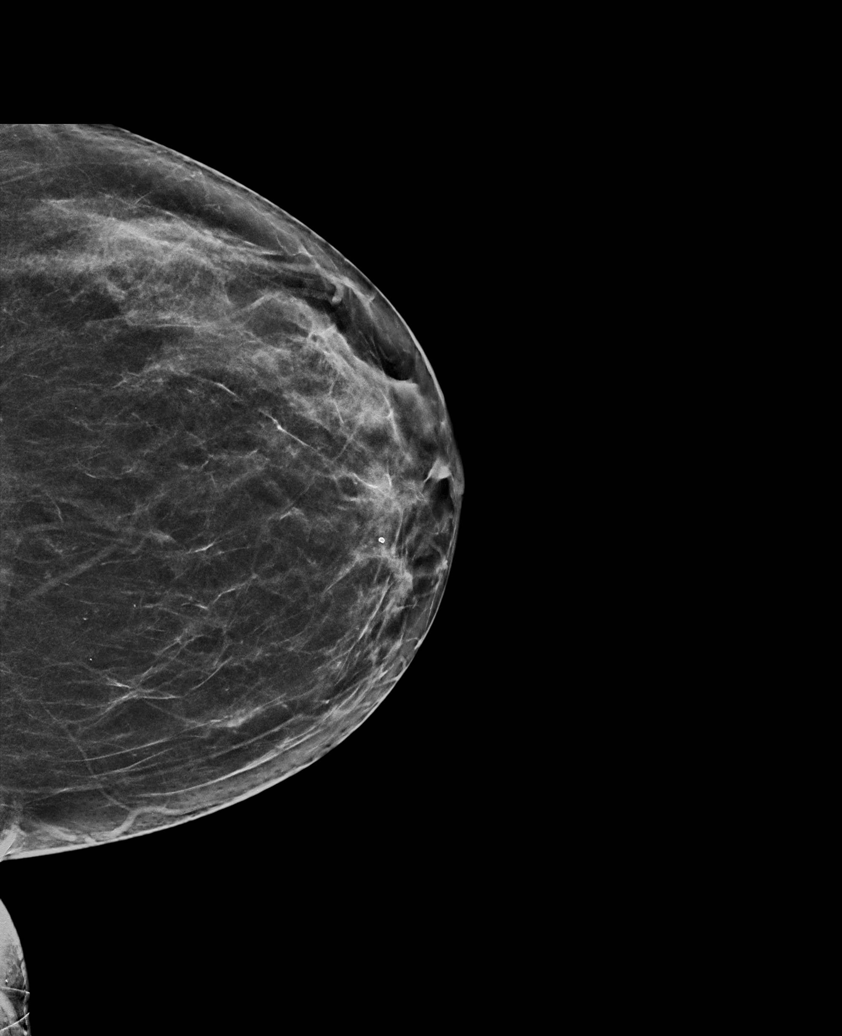

[L MLO tomo · tomo slice 37/72.0]
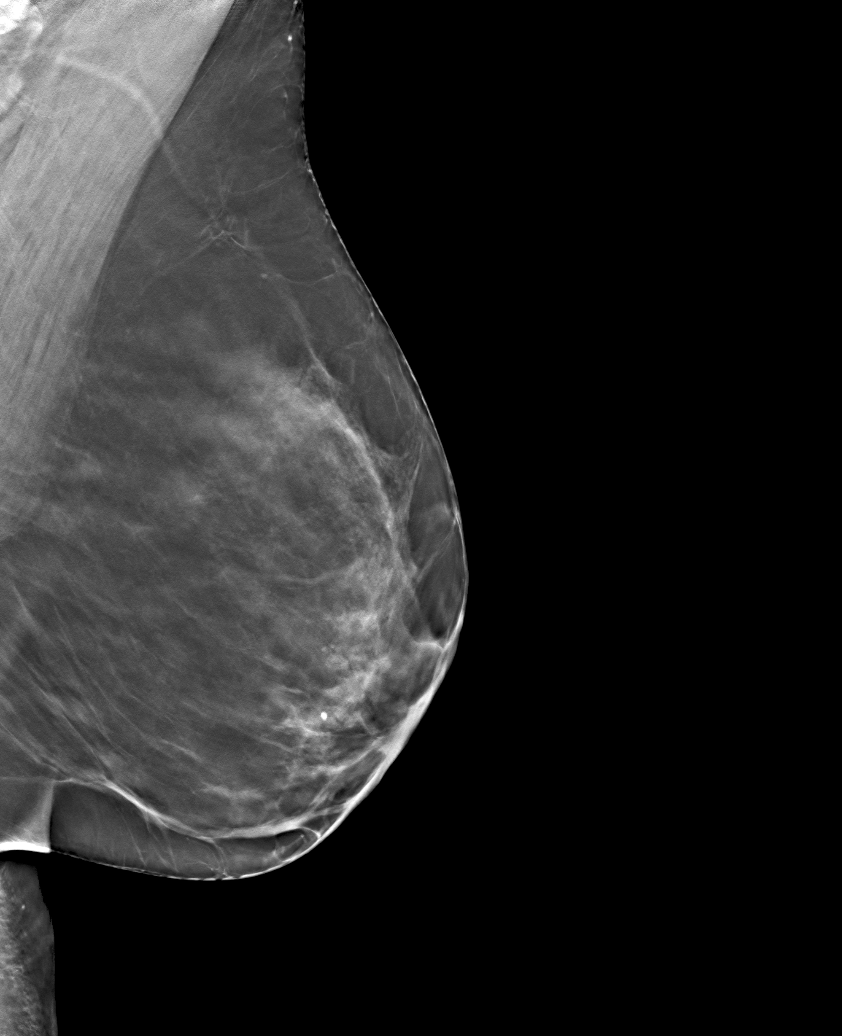

[6 of 30 positions shown; findings below may reference images not displayed]

ACR Breast Density Category c: The breast tissue is heterogeneously
dense, which may obscure small masses.
FINDINGS: Tomosynthesis and synthesized full field CC and MLO views of both
breasts were obtained. Tomosynthesis and synthesized spot
compression tangential view of the area of palpable concern in the
RIGHT breast was also obtained.

Interval increase in size of the previously identified mass in the
UPPER OUTER QUADRANT of the RIGHT breast at POSTERIOR depth, now
measuring greater than 3 cm, with lobulated margins. There is no
associated architectural distortion or suspicious calcifications. No
new or suspicious findings elsewhere in the RIGHT breast.

No findings suspicious for malignancy in the LEFT breast.

Mammographic images were processed with CAD.

On physical exam, there is a mobile palpable approximate 3 cm mass
in the UPPER OUTER QUADRANT of the RIGHT breast corresponding to
what the patient is feeling.

Targeted RIGHT breast ultrasound is performed, showing a solid
hypoechoic mass with irregular margins at the 10 o'clock position
approximately 8 cm from the nipple measuring approximately 1.5 x
x 2.7 cm, demonstrating posterior acoustic enhancement and
peripheral internal color Doppler flow, corresponding to the
palpable concern and the mammographic finding.

Sonographic evaluation of the RIGHT axilla demonstrates no
pathologic lymphadenopathy.
IMPRESSION: 1. Suspicious approximate 3.3 cm mass involving the UPPER OUTER
QUADRANT of the RIGHT breast corresponding to the palpable concern.
2. No pathologic RIGHT axillary lymphadenopathy.

RECOMMENDATION:
Ultrasound-guided core needle biopsy of the RIGHT breast mass.

The ultrasound core needle biopsy procedure was discussed with the
patient and her questions were answered. She has agreed to proceed
and the biopsy has been scheduled for [DATE] at 8 o'clock a.m..

I have discussed the findings and recommendations with the patient.
Results were also provided in writing at the conclusion of the
visit.

BI-RADS CATEGORY  4: Suspicious.

## 2018-12-25 ENCOUNTER — Other Ambulatory Visit: Payer: Self-pay | Admitting: Internal Medicine

## 2018-12-25 ENCOUNTER — Other Ambulatory Visit (HOSPITAL_COMMUNITY): Payer: Self-pay | Admitting: Internal Medicine

## 2018-12-25 ENCOUNTER — Ambulatory Visit (HOSPITAL_COMMUNITY)
Admission: RE | Admit: 2018-12-25 | Discharge: 2018-12-25 | Disposition: A | Discharge status: Home / Self Care | Payer: BC Managed Care – PPO | Source: Ambulatory Visit | Attending: Internal Medicine | Admitting: Internal Medicine

## 2018-12-25 DIAGNOSIS — R928 Other abnormal and inconclusive findings on diagnostic imaging of breast: Secondary | ICD-10-CM | POA: Diagnosis present

## 2018-12-25 DIAGNOSIS — E042 Nontoxic multinodular goiter: Secondary | ICD-10-CM

## 2018-12-25 DIAGNOSIS — E0500 Thyrotoxicosis with diffuse goiter without thyrotoxic crisis or storm: Secondary | ICD-10-CM

## 2018-12-25 DIAGNOSIS — IMO0002 Reserved for concepts with insufficient information to code with codable children: Secondary | ICD-10-CM

## 2018-12-25 DIAGNOSIS — R229 Localized swelling, mass and lump, unspecified: Secondary | ICD-10-CM | POA: Diagnosis present

## 2018-12-25 IMAGING — MG MM CLIP PLACEMENT
2 series · 2 of 2 positions shown · non-contrast
Comparison: Previous exam(s).

CLINICAL DATA: Post ultrasound-guided biopsy of a palpable mass in
the right breast at the 10 o'clock position.

EXAM:
DIAGNOSTIC RIGHT MAMMOGRAM POST ULTRASOUND BIOPSY

[R CC]
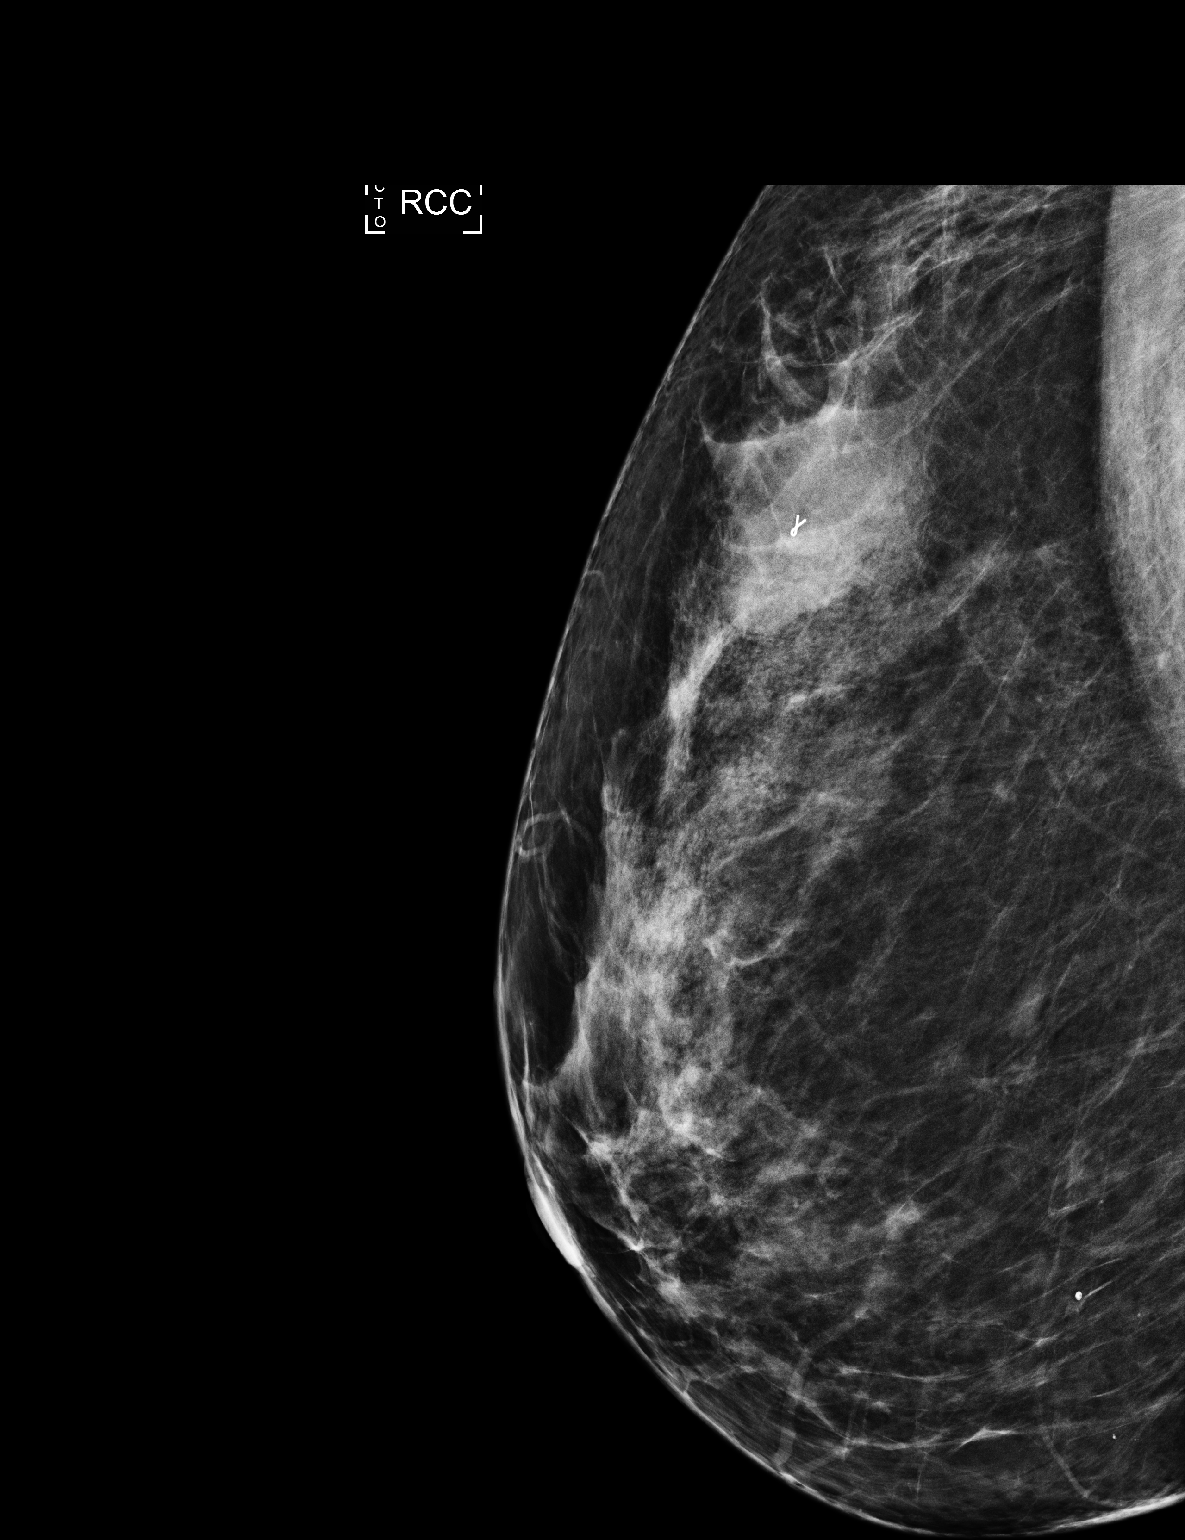

[R ML]
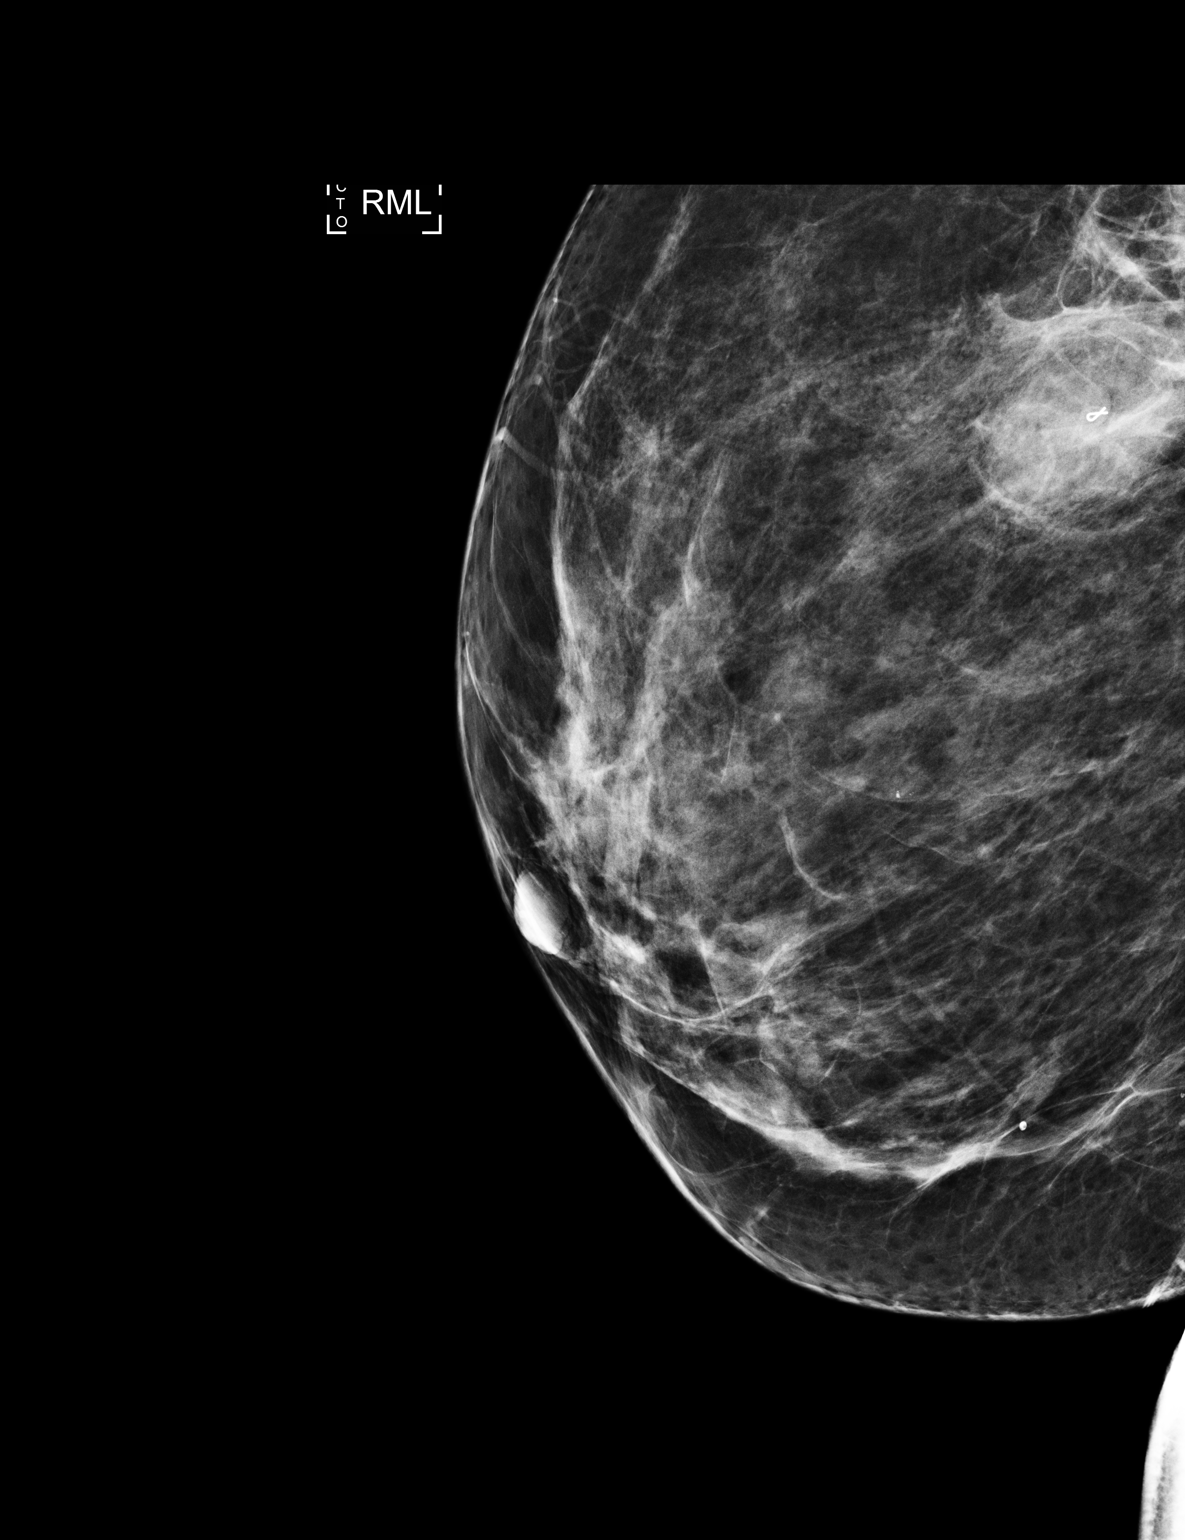

[2 of 2 positions shown; findings below may reference images not displayed]

FINDINGS: Mammographic images were obtained following ultrasound guided biopsy
of a palpable mass in the right breast at the 10 o'clock position. A
ribbon shaped biopsy marking clip is present at the site of the
biopsied mass in the right breast 10 position.
IMPRESSION: Ribbon shaped biopsy marking clip at site of biopsied mass in the
right breast at the 10 o'clock position.

Final Assessment: Post Procedure Mammograms for Marker Placement

## 2018-12-25 MED ORDER — LIDOCAINE HCL (PF) 1 % IJ SOLN
INTRAMUSCULAR | Status: AC
  Administered 2018-12-25: 2 mL
  Filled 2018-12-25: qty 5

## 2018-12-25 MED ORDER — LIDOCAINE-EPINEPHRINE (PF) 1 %-1:200000 IJ SOLN
INTRAMUSCULAR | Status: AC
  Administered 2018-12-25: 5 mL
  Filled 2018-12-25: qty 30

## 2019-01-04 ENCOUNTER — Ambulatory Visit (HOSPITAL_COMMUNITY)
Admission: RE | Admit: 2019-01-04 | Discharge: 2019-01-04 | Disposition: A | Discharge status: Home / Self Care | Payer: BC Managed Care – PPO | Source: Ambulatory Visit | Attending: Internal Medicine | Admitting: Internal Medicine

## 2019-01-04 DIAGNOSIS — E042 Nontoxic multinodular goiter: Secondary | ICD-10-CM | POA: Insufficient documentation

## 2019-01-04 DIAGNOSIS — E0500 Thyrotoxicosis with diffuse goiter without thyrotoxic crisis or storm: Secondary | ICD-10-CM | POA: Diagnosis not present

## 2019-01-04 IMAGING — US US THYROID
1 series · 12 of 25 positions shown · non-contrast
Comparison: [DATE]

CLINICAL DATA: Nontoxic multinodular goiter, Graves disease

EXAM:
THYROID ULTRASOUND
TECHNIQUE: Ultrasound examination of the thyroid gland and adjacent soft
tissues was performed.

[Series 1: us thyroid · 12 of 99 slices shown]
[im 5/99]
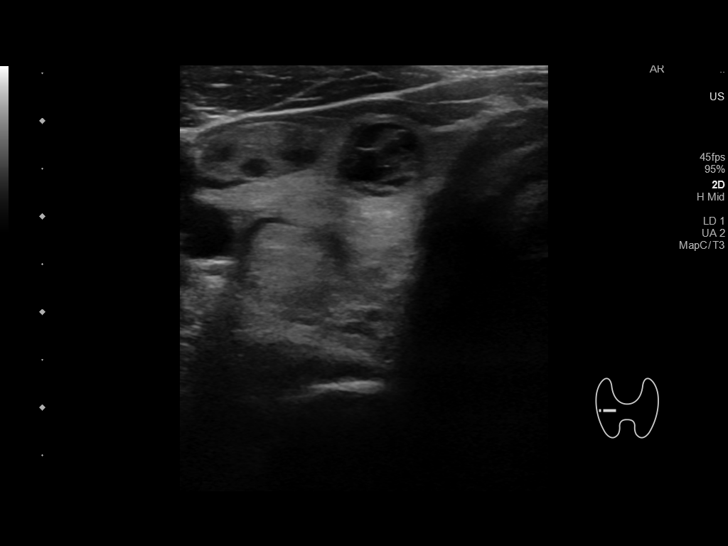
[im 13/99]
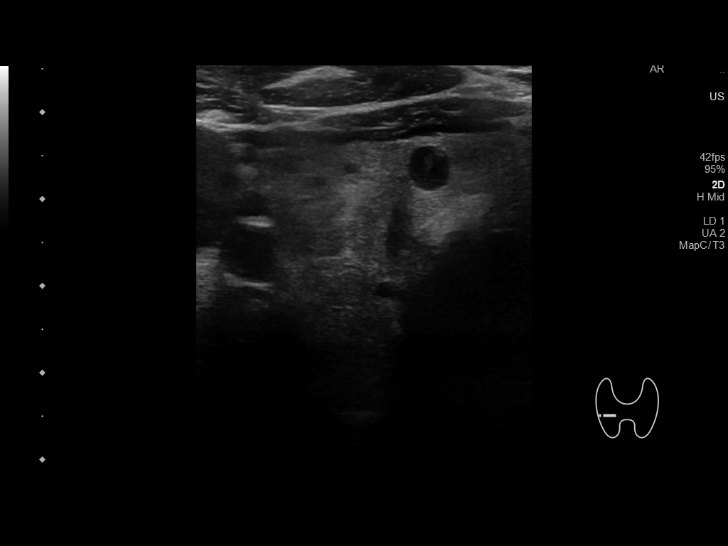
[im 21/99]
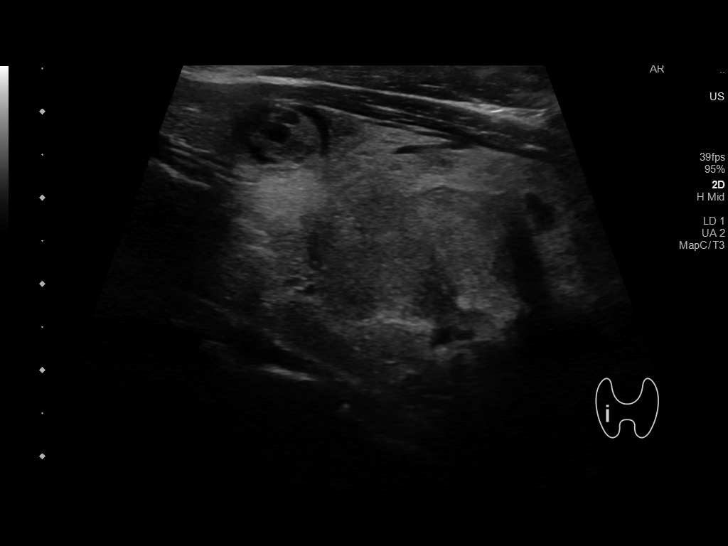
[im 29/99]
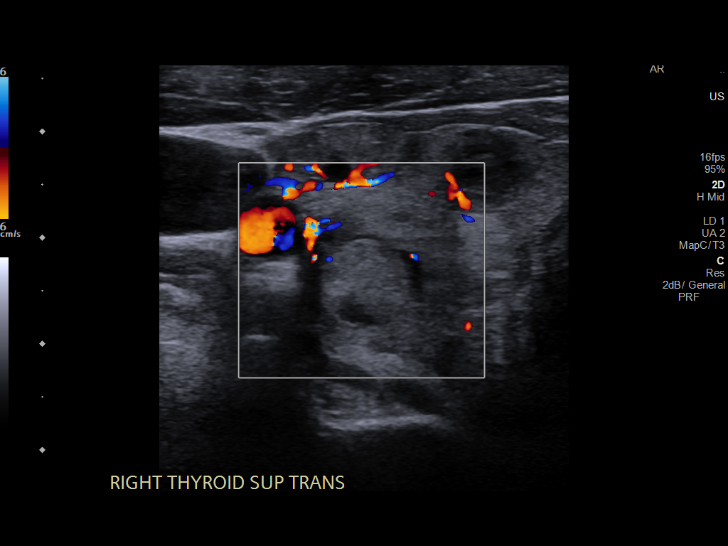
[im 37/99]
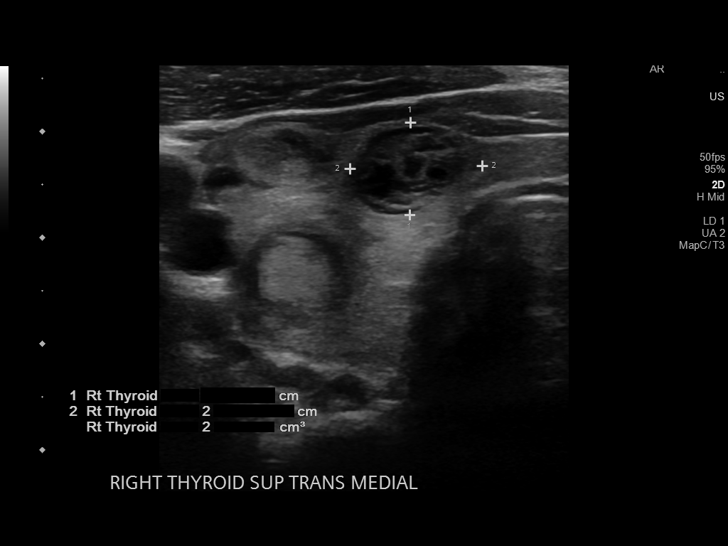
[im 45/99]
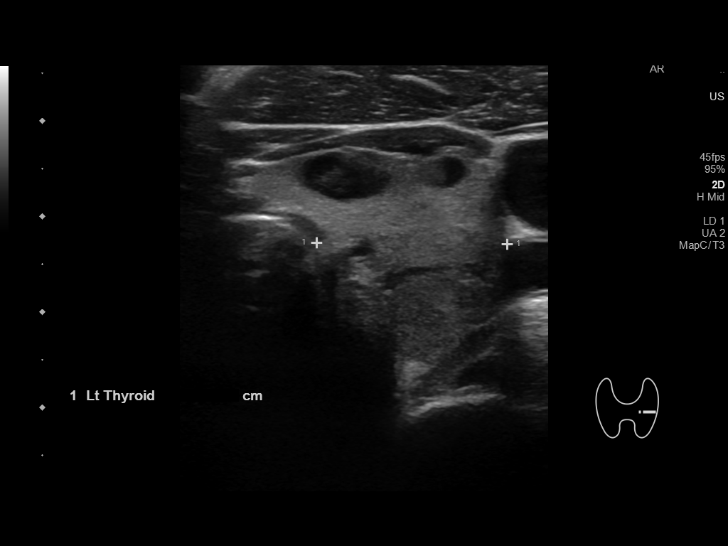
[im 54/99]
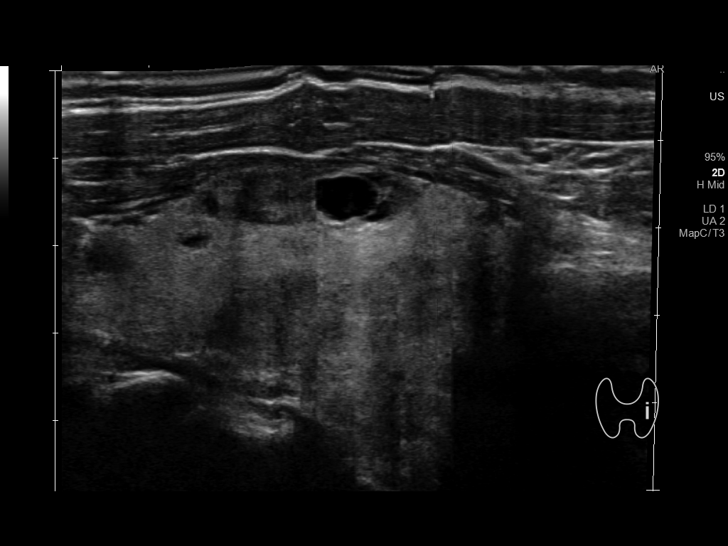
[im 62/99]
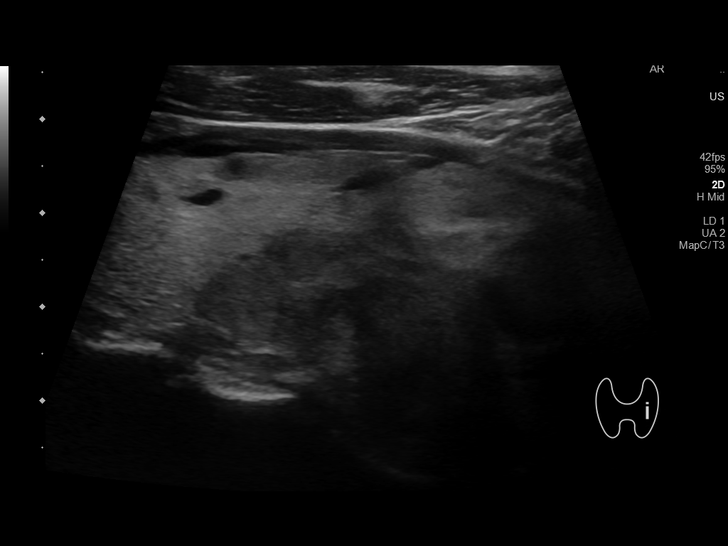
[im 70/99]
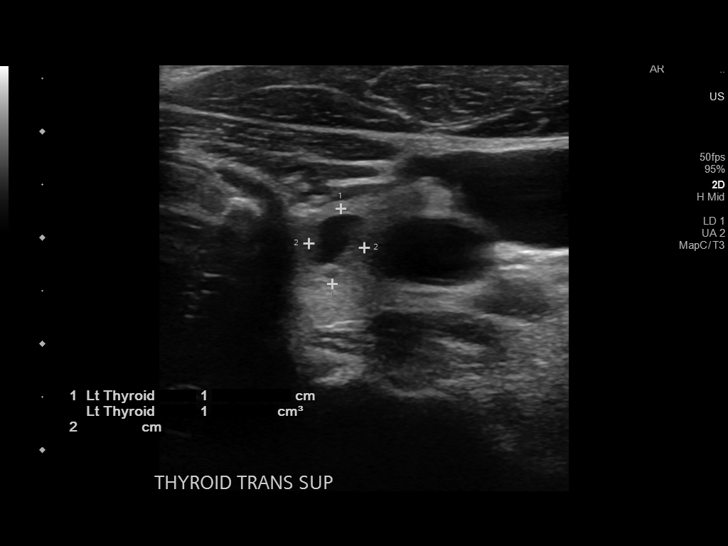
[im 78/99]
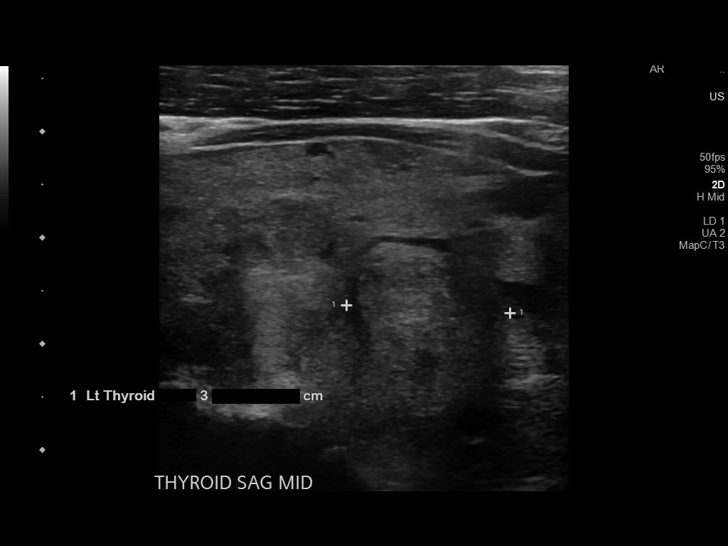
[im 86/99]
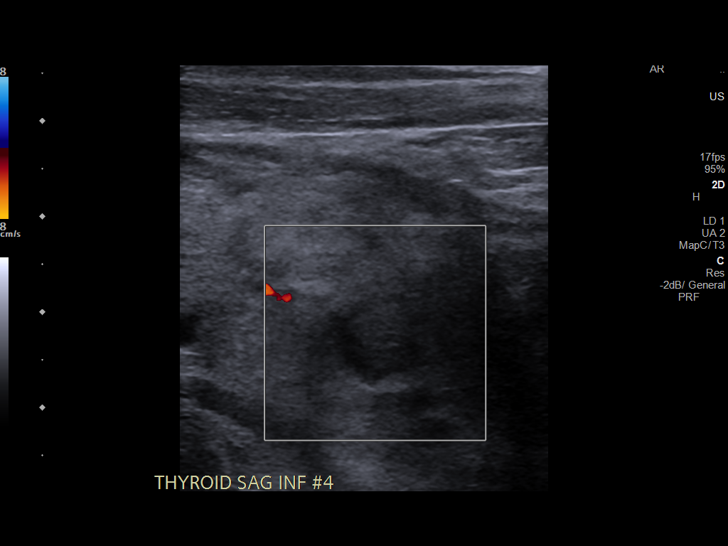
[im 94/99]
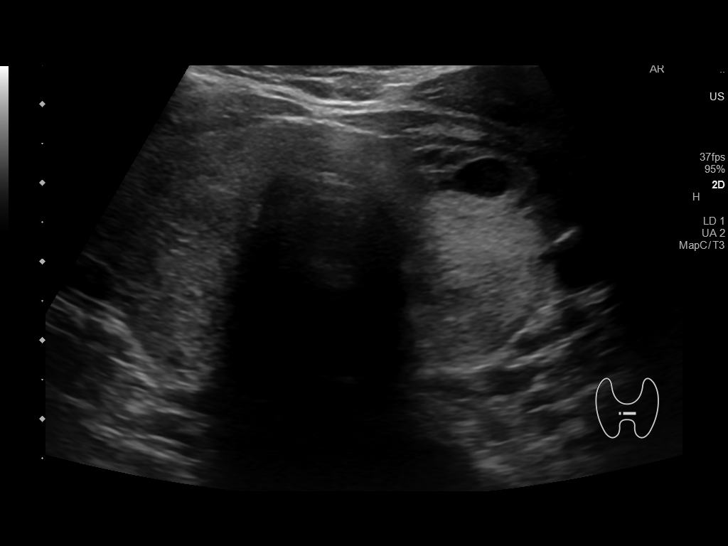

[12 of 25 positions shown; findings below may reference images not displayed]

FINDINGS: Parenchymal Echotexture: Moderately heterogenous

Isthmus: 0.4 cm thickness, previously

Right lobe: 7 x 2.9 x 2.5 cm, previously 8.3 x 3.4 x

Left lobe: 6.4 x 3 x 2 cm, previously 7.8 x 3.1 x

_________________________________________________________

Estimated total number of nodules >/= 1 cm: 5

Number of spongiform nodules >/=  2 cm not described below (TR1): 0

Number of mixed cystic and solid nodules >/= 1.5 cm not described
below (TR2): 0

_________________________________________________________

Nodule # 1:

Prior biopsy: No

Location: Right; Superior

Maximum size: 1.3 cm; Other 2 dimensions: 1.2 x 1 cm, previously,
1.8 x 1 x 1 cm

Composition: solid/almost completely solid (2)

Echogenicity: isoechoic (1)

Shape: not taller-than-wide (0)

Margins: ill-defined (0)

Echogenic foci: none (0)

ACR TI-RADS total points: 3.

ACR TI-RADS risk category:  TR3 (3 points).

Significant change in size (>/= 20% in two dimensions and minimal
increase of 2 mm): No

Change in features: No

Change in ACR TI-RADS risk category: No

ACR TI-RADS recommendations:

Given size (<1.4 cm) and appearance, this nodule does NOT meet
TI-RADS criteria for biopsy or dedicated follow-up.

_________________________________________________________

Nodule # 2:

Prior biopsy: No

Location: Right; Mid

Maximum size: 1.3 cm; Other 2 dimensions: 0.9 x 1.3 cm, previously,
1.2 x 1.2 x 0.8 cm

Composition: mixed cystic and solid (1)

Echogenicity: isoechoic (1)

Shape: not taller-than-wide (0)

Margins: smooth (0)

Echogenic foci: none (0)

ACR TI-RADS total points: 2.

ACR TI-RADS risk category:  TR2 (2 points).

Significant change in size (>/= 20% in two dimensions and minimal
increase of 2 mm): No

Change in features: No

Change in ACR TI-RADS risk category: No

ACR TI-RADS recommendations:

This nodule does NOT meet TI-RADS criteria for biopsy or dedicated
follow-up.

_________________________________________________________

0.8 cm hypoechoic nodule without calcifications, superior left,
previously 1.1; This nodule does NOT meet TI-RADS criteria for
biopsy or dedicated follow-up.

2.2 cm mixed solid/cystic isoechoic nodule without calcifications,
superior left, previously 2.3; This nodule does NOT meet TI-RADS
criteria for biopsy or dedicated follow-up.

Nodule # 5:

Prior biopsy: No

Location: Left; Mid

Maximum size: 1.5 cm; Other 2 dimensions: 1.3 x 1.3 cm, previously,
1.1 x 1 x 0.9 cm

Composition: solid/almost completely solid (2)

Echogenicity: isoechoic (1)

Shape: not taller-than-wide (0)

Margins: smooth (0)

Echogenic foci: none (0)

ACR TI-RADS total points: 3.

ACR TI-RADS risk category:  TR3 (3 points).

Significant change in size (>/= 20% in two dimensions and minimal
increase of 2 mm): No

Change in features: No

Change in ACR TI-RADS risk category: No

ACR TI-RADS recommendations:

*Given size (>/= 1.5 - 2.4 cm) and appearance, a follow-up
ultrasound in 1 year should be considered based on TI-RADS criteria.

_________________________________________________________

Nodule # 6:

Prior biopsy: No

Location: Left; Inferior

Maximum size: 1.8 cm; Other 2 dimensions: 1.2 x 1.2 cm, previously,
1.1 x 1 x 0.9 cm

Composition: solid/almost completely solid (2)

Echogenicity: hypoechoic (2)

Shape: taller-than-wide (3)

Margins: ill-defined (0)

Echogenic foci: none (0)

ACR TI-RADS total points: 7.

ACR TI-RADS risk category:  TR5 (>/= 7 points).

Significant change in size (>/= 20% in two dimensions and minimal
increase of 2 mm): Yes

Change in features: Yes

Change in ACR TI-RADS risk category: Yes

ACR TI-RADS recommendations:

**Given size (>/= 1.0 cm) and appearance, fine needle aspiration of
this highly suspicious nodule should be considered based on TI-RADS
criteria.
IMPRESSION: 1. Thyromegaly with multiple bilateral nodules.
2. Recommend FNA biopsy of suspicious 1.8 cm inferior left nodule.
3. Recommend annual/biennial ultrasound follow-up of additional
nodules as above, until stability x5 years confirmed.

The above is in keeping with the ACR TI-RADS recommendations - [HOSPITAL] [NR];[DATE].

## 2019-01-07 ENCOUNTER — Ambulatory Visit: Payer: Self-pay | Admitting: Surgery

## 2019-01-07 NOTE — H&P (Signed)
History of Present Illness Christina Brown. Christina Riling MD; 01/07/2019 12:39 PM) The patient is a 48 year old female who presents with a breast mass. PCP - Christina Brown  This is a 48 year old female referred to me for palpable right breast mass. I excised cyst from her neck last year. The neck incision is well-healed.  This is a healthy 48 year old female who presents with several years of a slowly enlarging mass in her right breast. This was followed with ultrasound and was finally biopsied this year. 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 It causes occasional discomfort. The patient is mildly concerned because it keeps getting larger. She presents now to discuss excision.  Menarche age 59 First pregnancy age 45 Breast-feed node Family history negative for breast cancer Last mesh. Current Hormones oral contraceptives 10 years.  CLINICAL DATA: Interval follow-up of a likely benign mass involving the UPPER OUTER QUADRANT of the RIGHT breast. The patient had been recommended for six-month follow-up and now returns at 14 months. Patient has palpated a lump in this location for approximately 1 month. Annual evaluation, LEFT breast.  EXAM: DIGITAL DIAGNOSTIC BILATERAL MAMMOGRAM WITH CAD AND TOMO  ULTRASOUND RIGHT BREAST  COMPARISON: Previous exam(s).  ACR Breast Density Category c: The breast tissue is heterogeneously dense, which may obscure small masses.  FINDINGS: Tomosynthesis and synthesized full field CC and MLO views of both breasts were obtained. Tomosynthesis and synthesized spot compression tangential view of the area of palpable concern in the RIGHT breast was also obtained.  Interval increase in size of the previously identified mass in the [place] of the RIGHT breast at POSTERIOR depth, now measuring greater than 3 cm, with lobulated margins. There is no associated architectural distortion or suspicious calcifications. No new or suspicious findings elsewhere in  the RIGHT breast.  No findings suspicious for malignancy in the LEFT breast.  Mammographic images were processed with CAD.  On physical exam, there is a mobile palpable approximate 3 cm mass in the UPPER OUTER QUADRANT of the RIGHT breast corresponding to what the patient is feeling.  Targeted RIGHT breast ultrasound is performed, showing a solid hypoechoic mass with irregular margins at the 10 o'clock position approximately 8 cm from the nipple measuring approximately 1.5 x 3.3 x 2.7 cm, demonstrating posterior acoustic enhancement and peripheral internal color Doppler flow, corresponding to the palpable concern and the mammographic finding.  Sonographic evaluation of the RIGHT axilla demonstrates no pathologic lymphadenopathy.  IMPRESSION: 1. Suspicious approximate 3.3 cm mass involving the UPPER OUTER QUADRANT of the RIGHT breast corresponding to the palpable concern. 2. No pathologic RIGHT axillary lymphadenopathy.  RECOMMENDATION: Ultrasound-guided core needle biopsy of the RIGHT breast mass.  The ultrasound core needle biopsy procedure was discussed with the patient and her questions were answered. She has agreed to proceed and the biopsy has been scheduled for February 25 at 8 o'clock a.m.  I have discussed the findings and recommendations with the patient. Results were also provided in writing at the conclusion of the visit.  BI-RADS CATEGORY 4: Suspicious.   Electronically Signed By: Christina Brown M.D. On: 12/18/2018 11:50   CLINICAL DATA: 48 year old female with a suspicious mass in the right breast at the 10 o'clock position.  EXAM: ULTRASOUND GUIDED RIGHT BREAST CORE NEEDLE BIOPSY  COMPARISON: Previous exam(s).  FINDINGS: I met with the patient and we discussed the procedure of ultrasound-guided biopsy, including benefits and alternatives. We discussed the high likelihood of a successful procedure. We discussed the risks of the procedure,  including infection, bleeding, tissue injury, clip migration, and inadequate sampling. Informed written consent was given. The usual time-out protocol was performed immediately prior to the procedure.  Lesion quadrant: Upper-outer  Using sterile technique and 1% Lidocaine as local anesthetic, under direct ultrasound visualization, a 14 gauge spring-loaded device was used to perform biopsy of the mass in the right breast at the 10 o'clock position using a lateral to medial approach. At the conclusion of the procedure a ribbon shaped tissue marker clip was deployed into the biopsy cavity. Follow up 2 view mammogram was performed and dictated separately.  IMPRESSION: Ultrasound guided biopsy of the mass in the right breast at the 10 o'clock position. No apparent complications.  Electronically Signed: By: Christina Alstrom M.D. On: 12/25/2018 08:32   ADDENDUM REPORT: 12/26/2018 11:39  ADDENDUM: Pathology of the RIGHT breast biopsy revealed Breast, right, needle core biopsy, right breast 10 o'clock FIBROADENOMA. NO MALIGNANCY IDENTIFIED.  This was found to be concordant by Dr. Shelly Brown.  Recommendation: Surgical excision due to growth.  At the patient's request, results and recommendations were relayed to the patient by phone by Christina Brown, Christina Brown on 12/26/2018 at 11:30 AM. The patient stated she did well following the biopsy with soreness but no bleeding. Post biopsy instructions were reviewed with the patient and all of her questions were answered. She was encouraged to contact the imaging department of [facility] with any further questions or concerns.  Addendum by Christina Brown, Christina Brown on 12/26/2018.   Electronically Signed By: Christina Alstrom M.D. On: 12/26/2018 11:39    Allergies Christina Brown, Christina Brown; 01/07/2019 10:11 AM) No Known Drug Allergies [09/10/2018]: Allergies Reconciled  Medication History Christina Brown, Christina Brown; 01/07/2019 10:11 AM) Escitalopram  Oxalate (5MG Tablet, Oral) Active. Atenolol (25MG Tablet, Oral) Active. methIMAzole (10MG Tablet, Oral) Active. Medications Reconciled    Vitals Christina Brown Christina Brown; 01/07/2019 10:12 AM) 01/07/2019 10:11 AM Weight: 182.13 lb Height: 66in Body Surface Area: 1.92 m Body Mass Index: 29.4 kg/m  Temp.: 95.31F(Oral)  Pulse: 66 (Regular)  P.OX: 96% (Room air) BP: 132/82 (Sitting, Left Arm, Standard)      Physical Exam Rodman Key K. Shoshannah Faubert MD; 01/07/2019 12:40 PM)  The physical exam findings are as follows: Note:WDWN in NAD Eyes: Pupils equal, round; sclera anicteric HENT: Oral mucosa moist; good dentition Neck: No masses palpated, no thyromegaly Lungs: CTA bilaterally; normal respiratory effort Breasts: appear symmetric; no nipple retraction or discharge. No axillary lympadenopathy. Firm palpable 3.5 cm mass in right upper outer quadrant near axilla. Mild bruising from biopsy site. No left breast masses CV: Regular rate and rhythm; no murmurs; extremities well-perfused with no edema Abd: +bowel sounds, soft, non-tender, no palpable organomegaly; no palpable hernias Skin: Warm, dry; no sign of jaundice Psychiatric - alert and oriented x 4; calm mood and affect    Assessment & Plan Rodman Key K. 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 The surgical procedure has been discussed with the patient. Potential risks, benefits, alternative treatments, and expected outcomes have been explained. All of the patient's questions at this time have been answered. The likelihood of reaching the patient's treatment goal is good. The patient understand the proposed surgical procedure and wishes to proceed.  Christina Brown. Georgette Dover, MD, [facility]  General/ Trauma Surgery Beeper [phone]  01/07/2019 12:41 PM

## 2019-01-28 ENCOUNTER — Other Ambulatory Visit: Payer: Self-pay | Admitting: Internal Medicine

## 2019-01-28 DIAGNOSIS — E042 Nontoxic multinodular goiter: Secondary | ICD-10-CM

## 2019-02-12 ENCOUNTER — Encounter (HOSPITAL_BASED_OUTPATIENT_CLINIC_OR_DEPARTMENT_OTHER): Payer: Self-pay

## 2019-02-12 ENCOUNTER — Ambulatory Visit (HOSPITAL_BASED_OUTPATIENT_CLINIC_OR_DEPARTMENT_OTHER): Admit: 2019-02-12 | Payer: BC Managed Care – PPO | Admitting: Surgery

## 2019-02-12 SURGERY — BREAST LUMPECTOMY
Anesthesia: General | Site: Breast | Laterality: Right

## 2019-03-19 ENCOUNTER — Other Ambulatory Visit: Payer: BC Managed Care – PPO

## 2019-03-19 ENCOUNTER — Ambulatory Visit
Admission: RE | Admit: 2019-03-19 | Discharge: 2019-03-19 | Disposition: A | Discharge status: Home / Self Care | Payer: BC Managed Care – PPO | Source: Ambulatory Visit | Attending: Internal Medicine | Admitting: Internal Medicine

## 2019-03-19 ENCOUNTER — Other Ambulatory Visit: Payer: Self-pay | Admitting: Internal Medicine

## 2019-03-19 DIAGNOSIS — E042 Nontoxic multinodular goiter: Secondary | ICD-10-CM

## 2019-03-19 IMAGING — US US THYROID
1 series · 8 of 8 positions shown · non-contrast
Comparison: Thyroid ultrasound - [DATE]; [DATE]

CLINICAL DATA: History of multinodular goiter now with
indeterminate 1.8 cm left-sided thyroid nodule which meets imaging
criteria to recommend percutaneous sampling. Patient presents today
for ultrasound-guided thyroid nodule fine-needle aspiration.

EXAM:
ULTRASOUND OF HEAD/NECK SOFT TISSUES
TECHNIQUE: Ultrasound examination of the head and neck soft tissues was
performed in the area of clinical concern.

[Series 1: us thyroid · 0.07mm/px · 8 acquisitions, 8 frames shown]
[im 1/8]
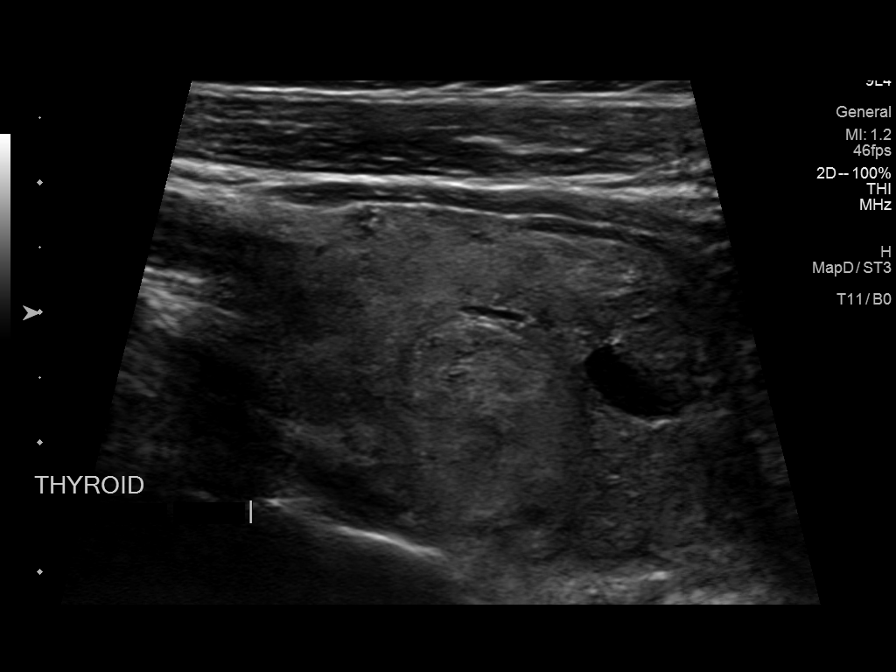
[im 2/8]
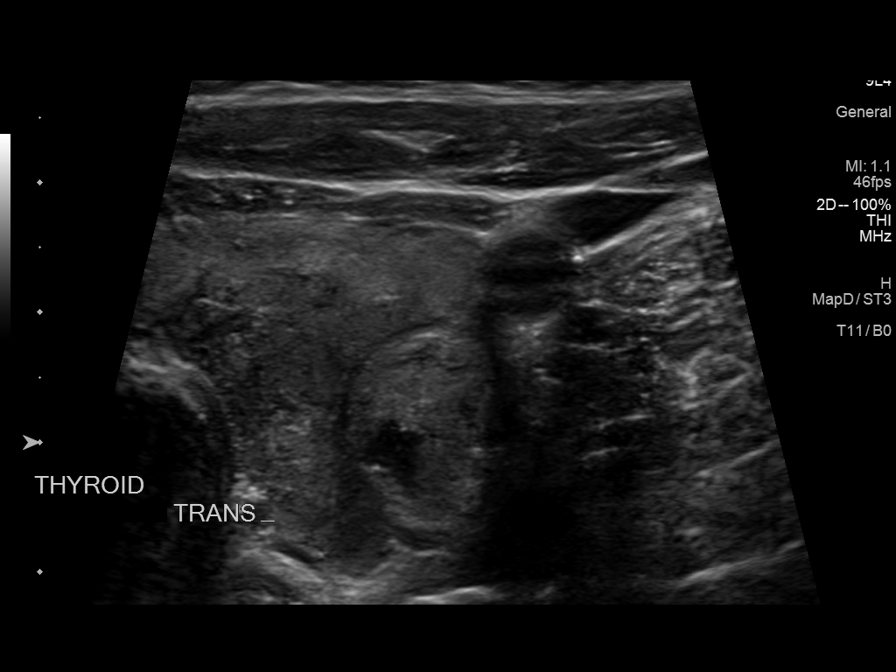
[im 3/8]
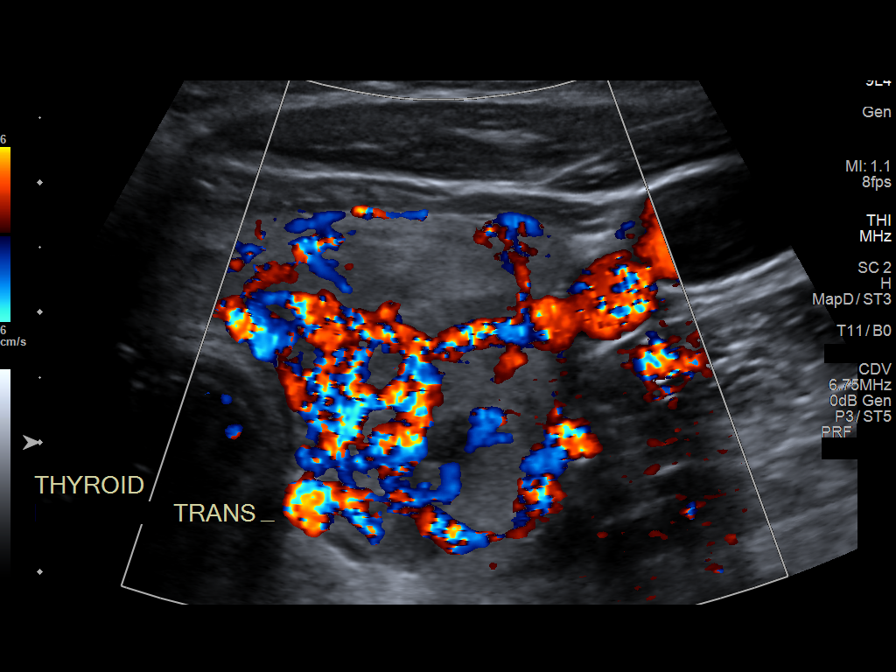
[im 4/8]
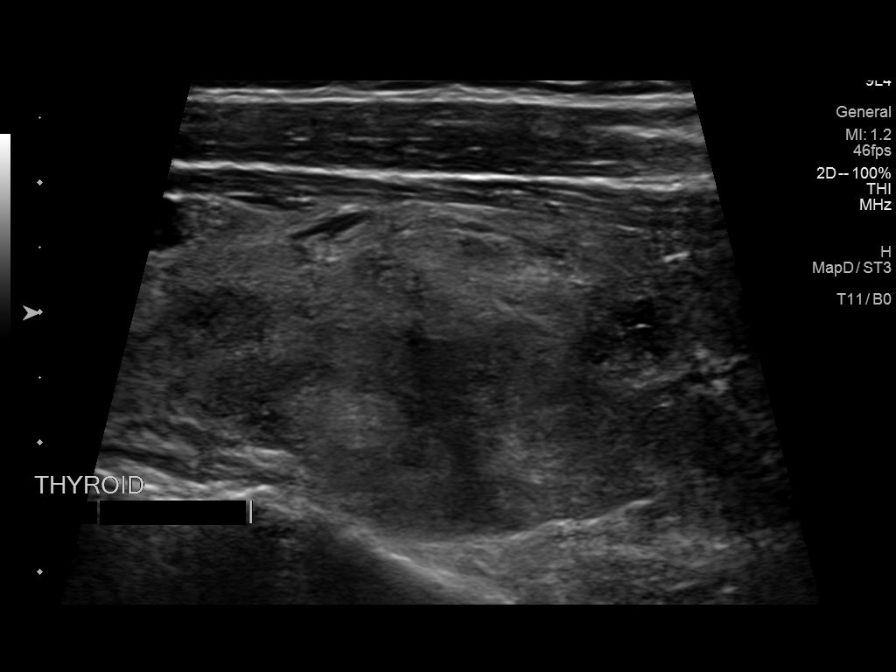
[im 5/8]
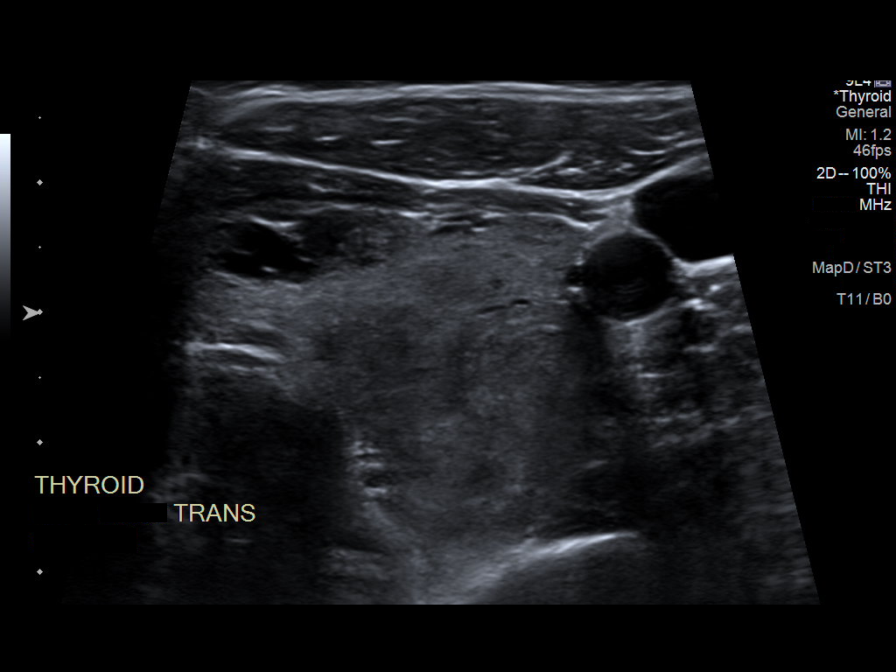
[im 6/8]
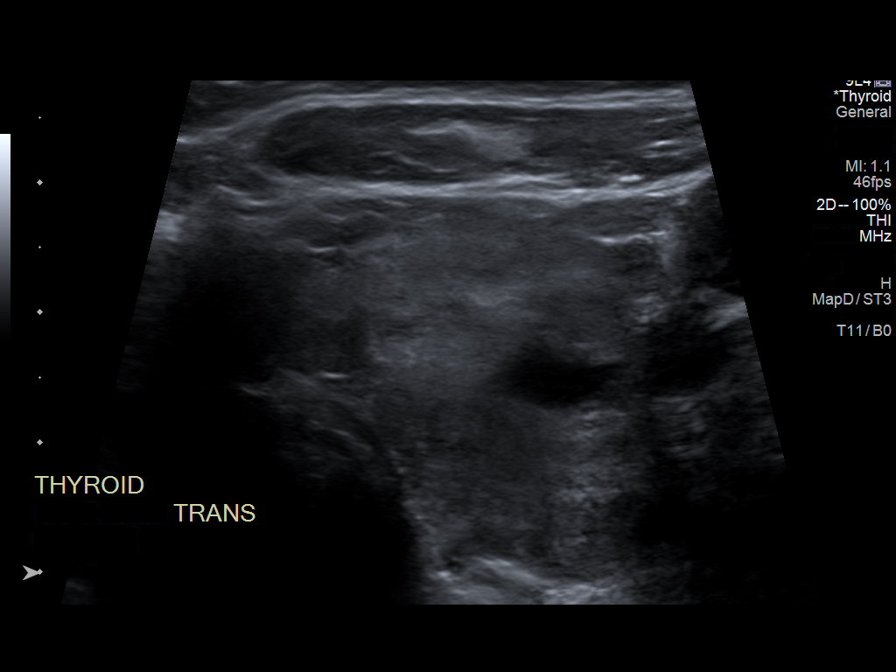
[im 7/8]
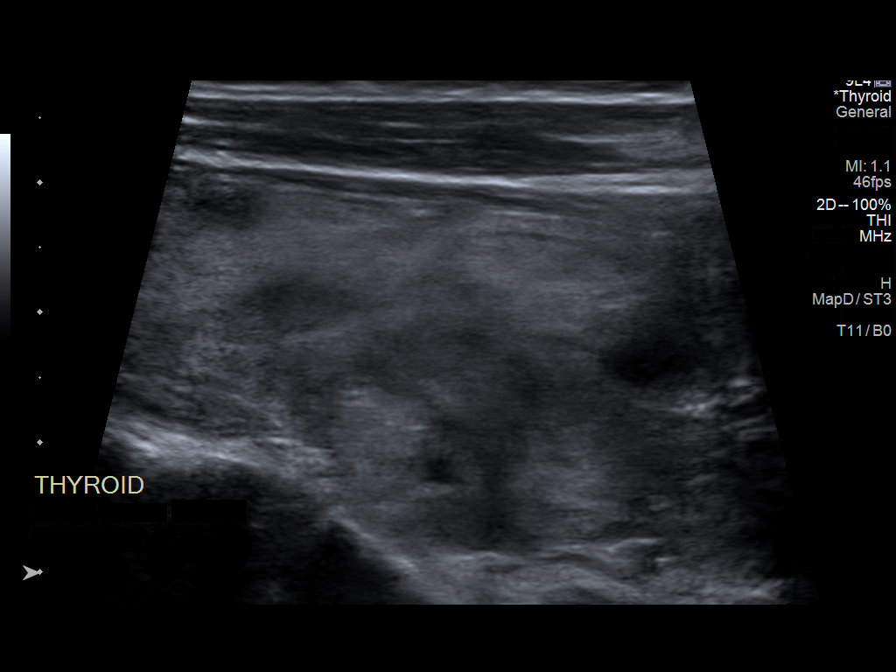
[im 8/8]
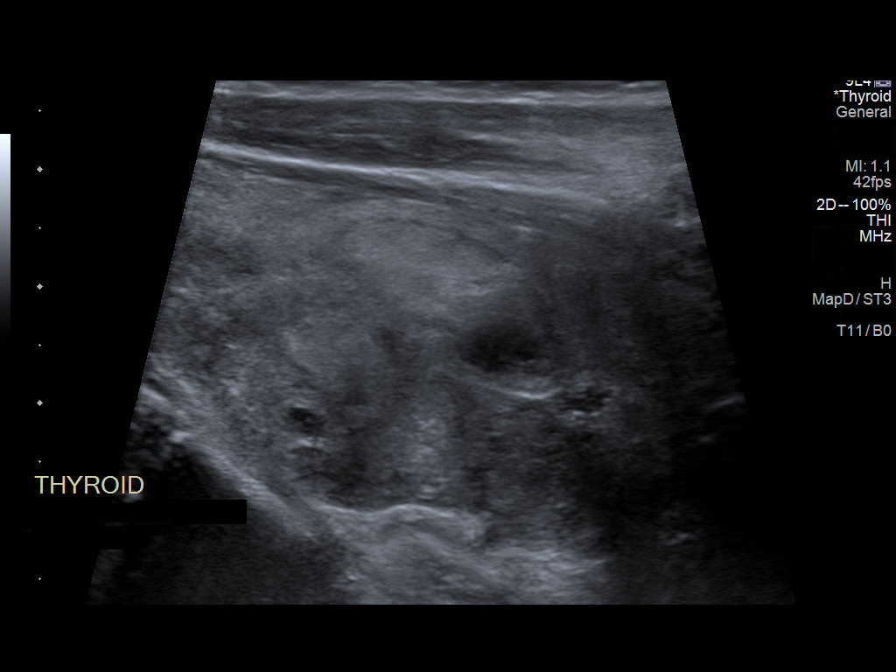

[8 of 8 positions shown; findings below may reference images not displayed]

FINDINGS: Sonographic evaluation of the thyroid gland performed by the
dictating interventional radiologist failed to definitively
reproduce the approximately 1.8 cm nodule within the inferior pole
of the left lobe of the thyroid (labeled #6) on preceding diagnostic
thyroid ultrasound, which is thus favored to have represented a
pseudonodule. No biopsy attempted.

Note, the additional, the exophytic approximately 2.1 cm nodule
arising from the inferior pole the left lobe of the thyroid on prior
diagnostic thyroid ultrasound performed [DATE] (also labeled #6 on
that examination) is felt to represent exophytic inferior medial
extension of left lobe of the thyroid as opposed to a discrete
nodule.
IMPRESSION: 1. Questioned approximately 1.8 cm nodule within the left lobe of
the thyroid (labeled 6) is not reproduced on today's examination
thus favored to have represented a pseudonodule. No biopsy
attempted.
2. Similar findings compatible with thyromegaly and multinodular
goiter.

## 2019-05-28 ENCOUNTER — Other Ambulatory Visit (HOSPITAL_COMMUNITY): Payer: Self-pay | Admitting: Family Medicine

## 2019-05-28 ENCOUNTER — Other Ambulatory Visit: Payer: Self-pay | Admitting: Family Medicine

## 2019-05-28 DIAGNOSIS — M25572 Pain in left ankle and joints of left foot: Secondary | ICD-10-CM

## 2019-05-28 DIAGNOSIS — M79662 Pain in left lower leg: Secondary | ICD-10-CM

## 2019-05-28 DIAGNOSIS — M7989 Other specified soft tissue disorders: Secondary | ICD-10-CM

## 2019-05-30 ENCOUNTER — Other Ambulatory Visit: Payer: Self-pay

## 2019-05-30 ENCOUNTER — Ambulatory Visit (HOSPITAL_COMMUNITY)
Admission: RE | Admit: 2019-05-30 | Discharge: 2019-05-30 | Disposition: A | Discharge status: Home / Self Care | Payer: BC Managed Care – PPO | Source: Ambulatory Visit | Attending: Family Medicine | Admitting: Family Medicine

## 2019-05-30 DIAGNOSIS — M25572 Pain in left ankle and joints of left foot: Secondary | ICD-10-CM | POA: Insufficient documentation

## 2019-05-30 DIAGNOSIS — M7989 Other specified soft tissue disorders: Secondary | ICD-10-CM | POA: Diagnosis present

## 2019-05-30 DIAGNOSIS — M79662 Pain in left lower leg: Secondary | ICD-10-CM | POA: Diagnosis present

## 2019-05-30 IMAGING — US VENOUS DOPPLER ULTRASOUND OF LEFT LOWER EXTREMITY
1 series · 13 of 24 positions shown · non-contrast
Comparison: None.

CLINICAL DATA: Left lower extremity pain and edema for the past 2
months. Former smoker. Evaluate for DVT.



[Series 1: venous doppler ultrasound of left lower extremity · 0.08mm/px · 13 of 81 slices shown]
[im 1/81]
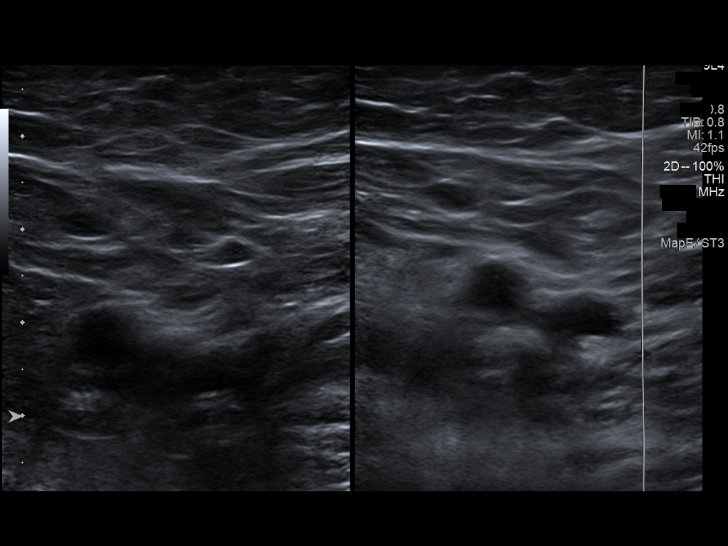
[im 7/81]
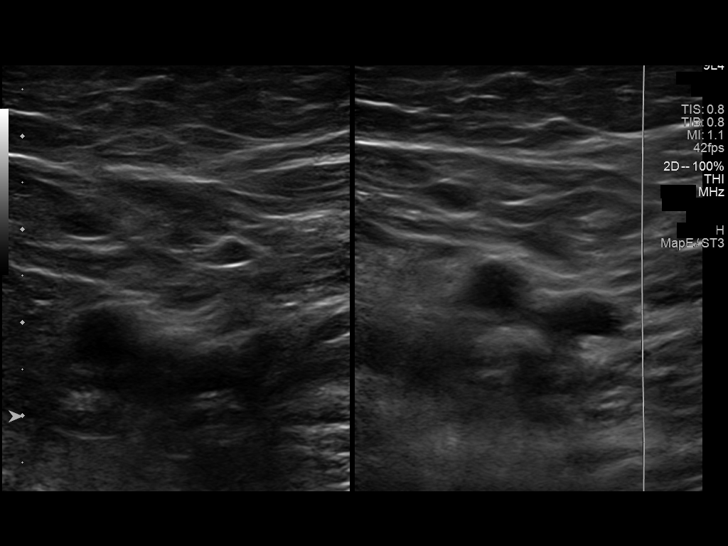
[im 14/81]
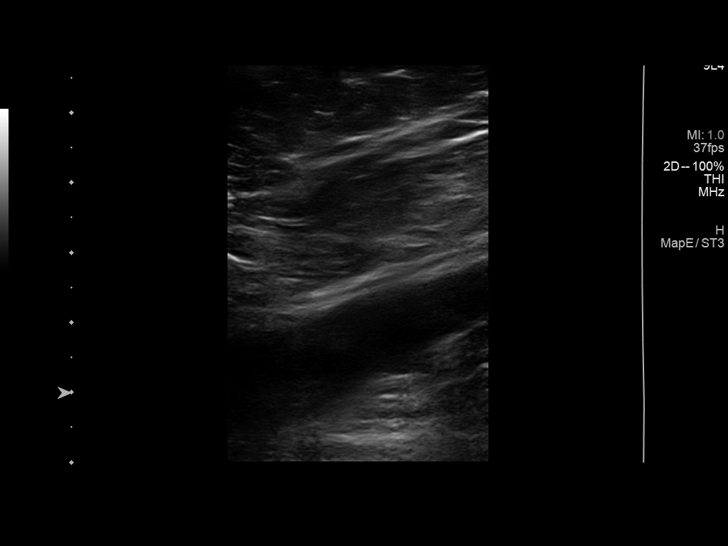
[im 21/81]
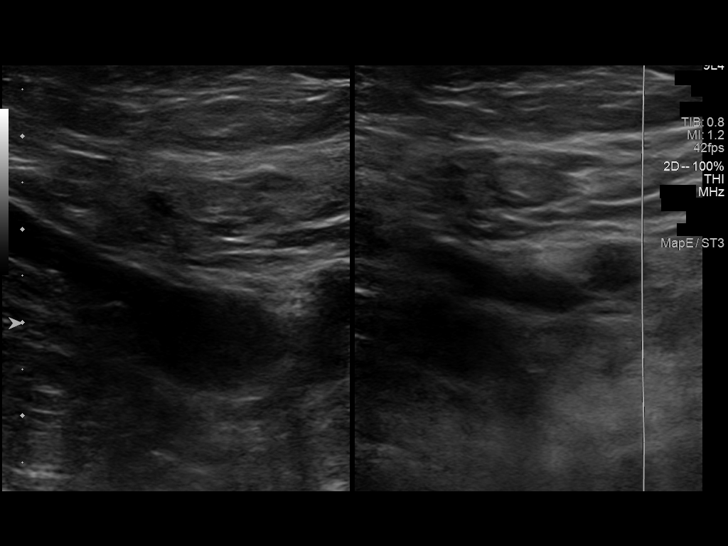
[im 28/81]
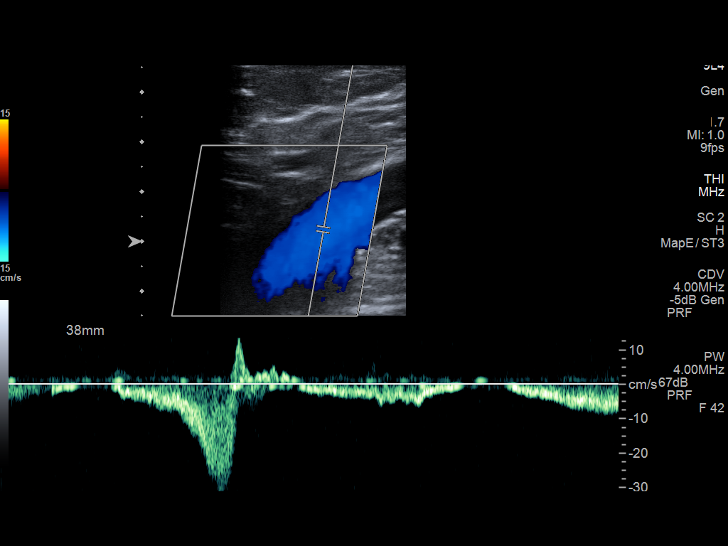
[im 35/81]
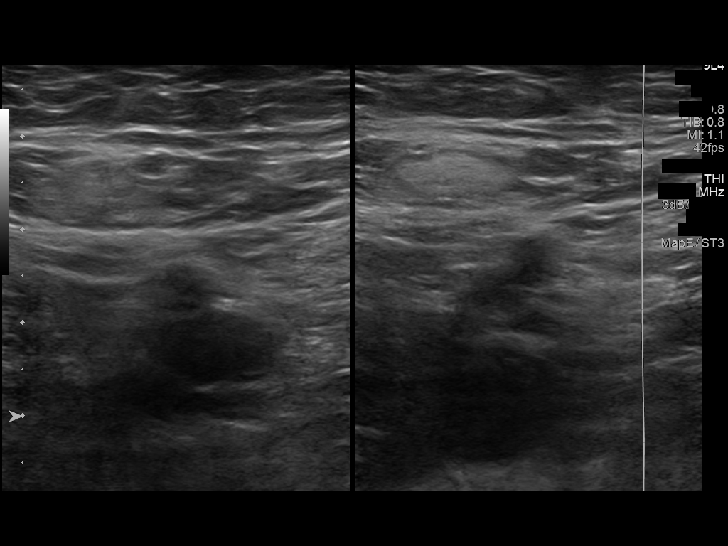
[im 42/81]
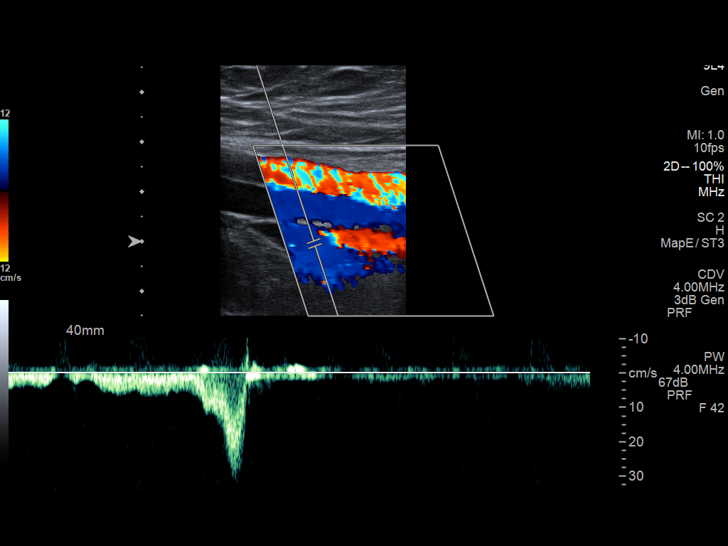
[im 46/81]
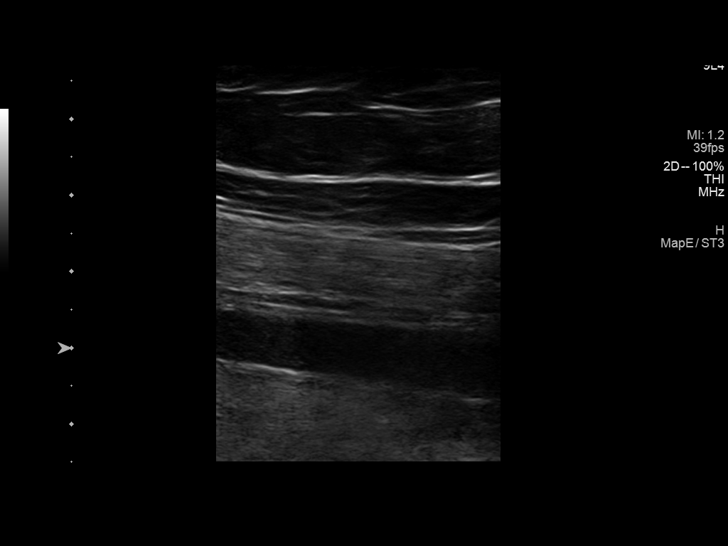
[im 53/81]
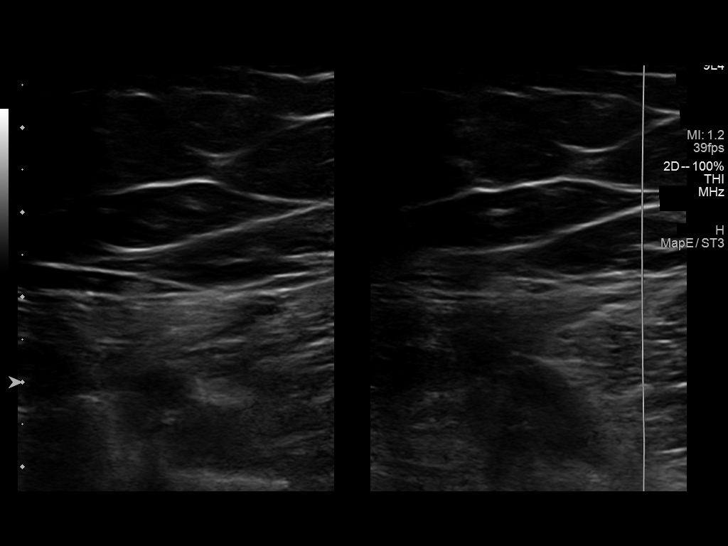
[im 60/81]
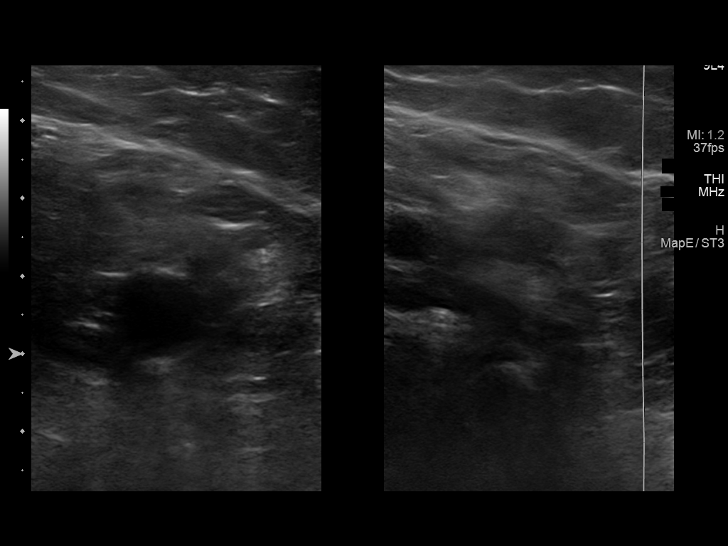
[im 67/81]
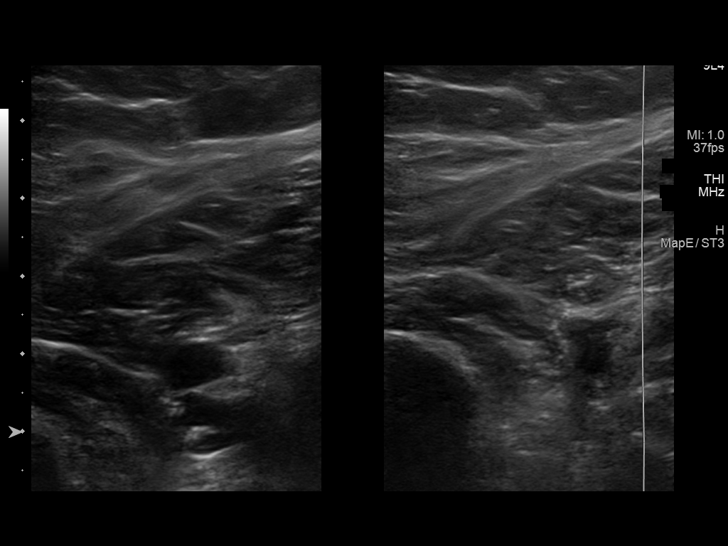
[im 74/81]
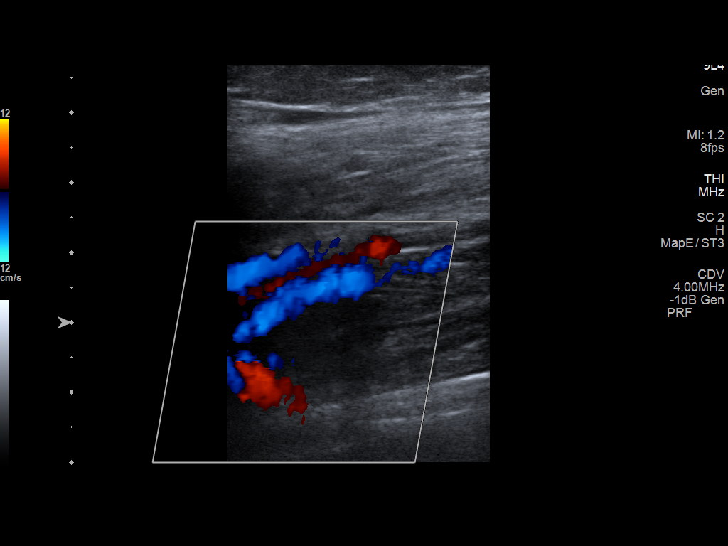
[im 81/81]
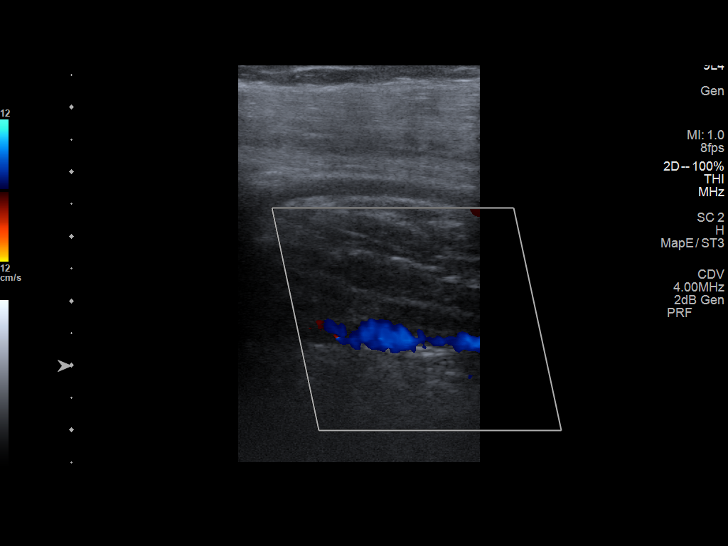

[13 of 24 positions shown; findings below may reference images not displayed]

FINDINGS: Contralateral Common Femoral Vein: Respiratory phasicity is normal
and symmetric with the symptomatic side. No evidence of thrombus.
Normal compressibility.

Common Femoral Vein: No evidence of thrombus. Normal
compressibility, respiratory phasicity and response to augmentation.

Saphenofemoral Junction: No evidence of thrombus. Normal
compressibility and flow on color Doppler imaging.

Profunda Femoral Vein: No evidence of thrombus. Normal
compressibility and flow on color Doppler imaging.

Femoral Vein: No evidence of thrombus. Normal compressibility,
respiratory phasicity and response to augmentation.

Popliteal Vein: No evidence of thrombus. Normal compressibility,
respiratory phasicity and response to augmentation.

Calf Veins: No evidence of thrombus. Normal compressibility and flow
on color Doppler imaging.

Superficial Great Saphenous Vein: No evidence of thrombus. Normal
compressibility.

Venous Reflux:  None.

Other Findings:  None.
IMPRESSION: No evidence of DVT within the left lower extremity.

## 2019-06-24 ENCOUNTER — Ambulatory Visit: Payer: Self-pay | Admitting: Surgery

## 2019-07-05 ENCOUNTER — Encounter (HOSPITAL_BASED_OUTPATIENT_CLINIC_OR_DEPARTMENT_OTHER): Payer: Self-pay

## 2019-07-05 ENCOUNTER — Other Ambulatory Visit: Payer: Self-pay

## 2019-07-12 ENCOUNTER — Encounter (HOSPITAL_BASED_OUTPATIENT_CLINIC_OR_DEPARTMENT_OTHER)
Admission: RE | Admit: 2019-07-12 | Discharge: 2019-07-12 | Disposition: A | Discharge status: Home / Self Care | Payer: BC Managed Care – PPO | Source: Ambulatory Visit | Attending: Surgery | Admitting: Surgery

## 2019-07-12 ENCOUNTER — Other Ambulatory Visit: Payer: Self-pay

## 2019-07-12 ENCOUNTER — Other Ambulatory Visit (HOSPITAL_COMMUNITY)
Admission: RE | Admit: 2019-07-12 | Discharge: 2019-07-12 | Disposition: A | Discharge status: Home / Self Care | Payer: BC Managed Care – PPO | Source: Ambulatory Visit | Attending: Surgery | Admitting: Surgery

## 2019-07-12 DIAGNOSIS — Z01818 Encounter for other preprocedural examination: Secondary | ICD-10-CM | POA: Insufficient documentation

## 2019-07-12 DIAGNOSIS — Z20828 Contact with and (suspected) exposure to other viral communicable diseases: Secondary | ICD-10-CM | POA: Diagnosis not present

## 2019-07-12 DIAGNOSIS — D241 Benign neoplasm of right breast: Secondary | ICD-10-CM | POA: Insufficient documentation

## 2019-07-12 LAB — POCT PREGNANCY, URINE: Preg Test, Ur: NEGATIVE

## 2019-07-12 NOTE — Progress Notes (Signed)
EKG reviewed by Dr. Tobias Alexander and will proceed with surgery as scheduled.Marland Kitchen

## 2019-07-12 NOTE — Progress Notes (Signed)

## 2019-07-13 LAB — NOVEL CORONAVIRUS, NAA (HOSP ORDER, SEND-OUT TO REF LAB; TAT 18-24 HRS): SARS-CoV-2, NAA: NOT DETECTED

## 2019-07-16 ENCOUNTER — Other Ambulatory Visit: Payer: Self-pay

## 2019-07-16 ENCOUNTER — Encounter (HOSPITAL_BASED_OUTPATIENT_CLINIC_OR_DEPARTMENT_OTHER)
Admission: RE | Disposition: A | Discharge status: Home / Self Care | Payer: Self-pay | Source: Home / Self Care | Attending: Surgery

## 2019-07-16 ENCOUNTER — Ambulatory Visit (HOSPITAL_BASED_OUTPATIENT_CLINIC_OR_DEPARTMENT_OTHER)
Admission: RE | Admit: 2019-07-16 | Discharge: 2019-07-16 | Disposition: A | Discharge status: Home / Self Care | Payer: BC Managed Care – PPO | Attending: Surgery | Admitting: Surgery

## 2019-07-16 ENCOUNTER — Encounter (HOSPITAL_BASED_OUTPATIENT_CLINIC_OR_DEPARTMENT_OTHER): Payer: Self-pay | Admitting: *Deleted

## 2019-07-16 ENCOUNTER — Ambulatory Visit (HOSPITAL_BASED_OUTPATIENT_CLINIC_OR_DEPARTMENT_OTHER): Payer: BC Managed Care – PPO | Admitting: Certified Registered"

## 2019-07-16 DIAGNOSIS — M329 Systemic lupus erythematosus, unspecified: Secondary | ICD-10-CM | POA: Insufficient documentation

## 2019-07-16 DIAGNOSIS — E0590 Thyrotoxicosis, unspecified without thyrotoxic crisis or storm: Secondary | ICD-10-CM | POA: Diagnosis not present

## 2019-07-16 DIAGNOSIS — F419 Anxiety disorder, unspecified: Secondary | ICD-10-CM | POA: Diagnosis not present

## 2019-07-16 DIAGNOSIS — D241 Benign neoplasm of right breast: Secondary | ICD-10-CM | POA: Diagnosis present

## 2019-07-16 DIAGNOSIS — Z79890 Hormone replacement therapy (postmenopausal): Secondary | ICD-10-CM | POA: Insufficient documentation

## 2019-07-16 DIAGNOSIS — Z87891 Personal history of nicotine dependence: Secondary | ICD-10-CM | POA: Insufficient documentation

## 2019-07-16 DIAGNOSIS — Z79899 Other long term (current) drug therapy: Secondary | ICD-10-CM | POA: Diagnosis not present

## 2019-07-16 HISTORY — PX: BREAST LUMPECTOMY: SHX2

## 2019-07-16 SURGERY — BREAST LUMPECTOMY
Anesthesia: General | Site: Breast | Laterality: Right

## 2019-07-16 MED ORDER — HYDROMORPHONE HCL 1 MG/ML IJ SOLN
0.2500 mg | INTRAMUSCULAR | Status: DC | PRN
  Administered 2019-07-16: 0.5 mg via INTRAVENOUS

## 2019-07-16 MED ORDER — MEPERIDINE HCL 25 MG/ML IJ SOLN: 6.2500 mg | INTRAMUSCULAR | Status: DC | PRN

## 2019-07-16 MED ORDER — DEXAMETHASONE SODIUM PHOSPHATE 4 MG/ML IJ SOLN
INTRAMUSCULAR | Status: DC | PRN
  Administered 2019-07-16: 10 mg via INTRAVENOUS

## 2019-07-16 MED ORDER — PROMETHAZINE HCL 25 MG/ML IJ SOLN: 6.2500 mg | INTRAMUSCULAR | Status: DC | PRN

## 2019-07-16 MED ORDER — GLYCOPYRROLATE 0.2 MG/ML IJ SOLN
INTRAMUSCULAR | Status: DC | PRN
  Administered 2019-07-16: 0.2 mg via INTRAVENOUS

## 2019-07-16 MED ORDER — OXYCODONE HCL 5 MG PO TABS: 5.0000 mg | ORAL_TABLET | Freq: Once | ORAL | Status: DC | PRN

## 2019-07-16 MED ORDER — CEFAZOLIN SODIUM-DEXTROSE 2-4 GM/100ML-% IV SOLN
2.0000 g | INTRAVENOUS | Status: AC
  Administered 2019-07-16: 2 g via INTRAVENOUS

## 2019-07-16 MED ORDER — EPHEDRINE SULFATE-NACL 50-0.9 MG/10ML-% IV SOSY
PREFILLED_SYRINGE | INTRAVENOUS | Status: DC | PRN
  Administered 2019-07-16 (×4): 10 mg via INTRAVENOUS

## 2019-07-16 MED ORDER — ONDANSETRON HCL 4 MG/2ML IJ SOLN
INTRAMUSCULAR | Status: AC
  Filled 2019-07-16: qty 2

## 2019-07-16 MED ORDER — EPHEDRINE 5 MG/ML INJ
INTRAVENOUS | Status: AC
  Filled 2019-07-16: qty 10

## 2019-07-16 MED ORDER — LACTATED RINGERS IV SOLN
INTRAVENOUS | Status: DC
  Administered 2019-07-16 (×2): via INTRAVENOUS

## 2019-07-16 MED ORDER — SCOPOLAMINE 1 MG/3DAYS TD PT72: 1.0000 | MEDICATED_PATCH | Freq: Once | TRANSDERMAL | Status: DC

## 2019-07-16 MED ORDER — FENTANYL CITRATE (PF) 100 MCG/2ML IJ SOLN
INTRAMUSCULAR | Status: DC | PRN
  Administered 2019-07-16: 50 ug via INTRAVENOUS

## 2019-07-16 MED ORDER — HYDROCODONE-ACETAMINOPHEN 5-325 MG PO TABS: 1.0000 | ORAL_TABLET | Freq: Four times a day (QID) | ORAL | 0 refills | Status: DC | PRN

## 2019-07-16 MED ORDER — CHLORHEXIDINE GLUCONATE CLOTH 2 % EX PADS: 6.0000 | MEDICATED_PAD | Freq: Once | CUTANEOUS | Status: DC

## 2019-07-16 MED ORDER — CEFAZOLIN SODIUM-DEXTROSE 2-4 GM/100ML-% IV SOLN
INTRAVENOUS | Status: AC
  Filled 2019-07-16: qty 100

## 2019-07-16 MED ORDER — BUPIVACAINE-EPINEPHRINE 0.25% -1:200000 IJ SOLN
INTRAMUSCULAR | Status: DC | PRN
  Administered 2019-07-16: 10 mL

## 2019-07-16 MED ORDER — GABAPENTIN 300 MG PO CAPS
300.0000 mg | ORAL_CAPSULE | ORAL | Status: AC
  Administered 2019-07-16: 300 mg via ORAL

## 2019-07-16 MED ORDER — DEXAMETHASONE SODIUM PHOSPHATE 10 MG/ML IJ SOLN
INTRAMUSCULAR | Status: AC
  Filled 2019-07-16: qty 1

## 2019-07-16 MED ORDER — MIDAZOLAM HCL 5 MG/5ML IJ SOLN
INTRAMUSCULAR | Status: DC | PRN
  Administered 2019-07-16: 2 mg via INTRAVENOUS

## 2019-07-16 MED ORDER — ACETAMINOPHEN 500 MG PO TABS
ORAL_TABLET | ORAL | Status: AC
  Filled 2019-07-16: qty 2

## 2019-07-16 MED ORDER — ACETAMINOPHEN 500 MG PO TABS
1000.0000 mg | ORAL_TABLET | ORAL | Status: AC
  Administered 2019-07-16: 07:00:00 1000 mg via ORAL

## 2019-07-16 MED ORDER — LIDOCAINE 2% (20 MG/ML) 5 ML SYRINGE
INTRAMUSCULAR | Status: DC | PRN
  Administered 2019-07-16: 60 mg via INTRAVENOUS

## 2019-07-16 MED ORDER — FENTANYL CITRATE (PF) 100 MCG/2ML IJ SOLN
INTRAMUSCULAR | Status: AC
  Filled 2019-07-16: qty 2

## 2019-07-16 MED ORDER — OXYCODONE HCL 5 MG/5ML PO SOLN: 5.0000 mg | Freq: Once | ORAL | Status: DC | PRN

## 2019-07-16 MED ORDER — GABAPENTIN 300 MG PO CAPS
ORAL_CAPSULE | ORAL | Status: AC
  Filled 2019-07-16: qty 1

## 2019-07-16 MED ORDER — ONDANSETRON HCL 4 MG/2ML IJ SOLN
INTRAMUSCULAR | Status: DC | PRN
  Administered 2019-07-16: 4 mg via INTRAVENOUS

## 2019-07-16 MED ORDER — MIDAZOLAM HCL 2 MG/2ML IJ SOLN
INTRAMUSCULAR | Status: AC
  Filled 2019-07-16: qty 2

## 2019-07-16 MED ORDER — HYDROMORPHONE HCL 1 MG/ML IJ SOLN
INTRAMUSCULAR | Status: AC
  Filled 2019-07-16: qty 0.5

## 2019-07-16 MED ORDER — PROPOFOL 10 MG/ML IV BOLUS
INTRAVENOUS | Status: DC | PRN
  Administered 2019-07-16: 200 mg via INTRAVENOUS

## 2019-07-16 SURGICAL SUPPLY — 53 items
APPLICATOR COTTON TIP 6 STRL (MISCELLANEOUS) IMPLANT
APPLICATOR COTTON TIP 6IN STRL (MISCELLANEOUS)
APPLIER CLIP 9.375 MED OPEN (MISCELLANEOUS)
BENZOIN TINCTURE PRP APPL 2/3 (GAUZE/BANDAGES/DRESSINGS) ×2 IMPLANT
BLADE HEX COATED 2.75 (ELECTRODE) ×2 IMPLANT
BLADE SURG 15 STRL LF DISP TIS (BLADE) ×1 IMPLANT
BLADE SURG 15 STRL SS (BLADE) ×1
CANISTER SUCT 1200ML W/VALVE (MISCELLANEOUS) ×2 IMPLANT
CHLORAPREP W/TINT 26 (MISCELLANEOUS) ×2 IMPLANT
CLIP APPLIE 9.375 MED OPEN (MISCELLANEOUS) IMPLANT
COVER BACK TABLE REUSABLE LG (DRAPES) ×2 IMPLANT
COVER MAYO STAND REUSABLE (DRAPES) ×2 IMPLANT
COVER WAND RF STERILE (DRAPES) IMPLANT
DECANTER SPIKE VIAL GLASS SM (MISCELLANEOUS) IMPLANT
DRAPE LAPAROTOMY 100X72 PEDS (DRAPES) ×2 IMPLANT
DRAPE UTILITY XL STRL (DRAPES) ×2 IMPLANT
DRSG TEGADERM 4X4.75 (GAUZE/BANDAGES/DRESSINGS) ×2 IMPLANT
ELECT BLADE 4.0 EZ CLEAN MEGAD (MISCELLANEOUS) ×2
ELECT REM PT RETURN 9FT ADLT (ELECTROSURGICAL) ×2
ELECTRODE BLDE 4.0 EZ CLN MEGD (MISCELLANEOUS) IMPLANT
ELECTRODE REM PT RTRN 9FT ADLT (ELECTROSURGICAL) ×1 IMPLANT
GAUZE SPONGE 4X4 12PLY STRL LF (GAUZE/BANDAGES/DRESSINGS) ×2 IMPLANT
GLOVE BIO SURGEON STRL SZ 6.5 (GLOVE) ×1 IMPLANT
GLOVE BIO SURGEON STRL SZ7 (GLOVE) ×2 IMPLANT
GLOVE BIOGEL PI IND STRL 6.5 (GLOVE) IMPLANT
GLOVE BIOGEL PI IND STRL 7.5 (GLOVE) ×1 IMPLANT
GLOVE BIOGEL PI INDICATOR 6.5 (GLOVE) ×1
GLOVE BIOGEL PI INDICATOR 7.5 (GLOVE) ×2
GLOVE SURG SS PI 7.0 STRL IVOR (GLOVE) ×1 IMPLANT
GOWN STRL REUS W/ TWL LRG LVL3 (GOWN DISPOSABLE) ×2 IMPLANT
GOWN STRL REUS W/TWL LRG LVL3 (GOWN DISPOSABLE) ×2
ILLUMINATOR WAVEGUIDE N/F (MISCELLANEOUS) IMPLANT
KIT MARKER MARGIN INK (KITS) ×1 IMPLANT
LIGHT WAVEGUIDE WIDE FLAT (MISCELLANEOUS) IMPLANT
NDL HYPO 25X1 1.5 SAFETY (NEEDLE) ×1 IMPLANT
NEEDLE HYPO 25X1 1.5 SAFETY (NEEDLE) ×2 IMPLANT
NS IRRIG 1000ML POUR BTL (IV SOLUTION) ×2 IMPLANT
PACK BASIN DAY SURGERY FS (CUSTOM PROCEDURE TRAY) ×2 IMPLANT
PENCIL BUTTON HOLSTER BLD 10FT (ELECTRODE) ×2 IMPLANT
SLEEVE SCD COMPRESS KNEE MED (MISCELLANEOUS) ×2 IMPLANT
SPONGE LAP 4X18 RFD (DISPOSABLE) ×2 IMPLANT
STRIP CLOSURE SKIN 1/2X4 (GAUZE/BANDAGES/DRESSINGS) ×2 IMPLANT
SUT CHROMIC 3 0 SH 27 (SUTURE) IMPLANT
SUT MON AB 4-0 PC3 18 (SUTURE) ×2 IMPLANT
SUT SILK 2 0 SH (SUTURE) IMPLANT
SUT VIC AB 3-0 SH 27 (SUTURE) ×1
SUT VIC AB 3-0 SH 27X BRD (SUTURE) ×1 IMPLANT
SYR BULB 3OZ (MISCELLANEOUS) ×2 IMPLANT
SYR CONTROL 10ML LL (SYRINGE) ×2 IMPLANT
TOWEL GREEN STERILE FF (TOWEL DISPOSABLE) ×2 IMPLANT
TRAY FAXITRON CT DISP (TRAY / TRAY PROCEDURE) IMPLANT
TUBE CONNECTING 20X1/4 (TUBING) ×2 IMPLANT
YANKAUER SUCT BULB TIP NO VENT (SUCTIONS) ×2 IMPLANT

## 2019-07-16 NOTE — Anesthesia Preprocedure Evaluation (Signed)
Anesthesia Evaluation  Patient identified by MRN, date of birth, ID band Patient awake    Reviewed: Allergy & Precautions, H&P , NPO status , Patient's Chart, lab work & pertinent test results  Airway Mallampati: II   Neck ROM: full    Dental  (+) Dental Advisory Given   Pulmonary former smoker,    breath sounds clear to auscultation       Cardiovascular negative cardio ROS   Rhythm:regular Rate:Normal     Neuro/Psych PSYCHIATRIC DISORDERS Anxiety Depression    GI/Hepatic GERD  ,  Endo/Other  Hyperthyroidism   Renal/GU      Musculoskeletal   Abdominal   Peds  Hematology   Anesthesia Other Findings   Reproductive/Obstetrics                             Anesthesia Physical  Anesthesia Plan  ASA: II  Anesthesia Plan: General   Post-op Pain Management:    Induction: Intravenous  PONV Risk Score and Plan: 3 and Ondansetron, Dexamethasone, Midazolam and Treatment may vary due to age or medical condition  Airway Management Planned: LMA  Additional Equipment:   Intra-op Plan:   Post-operative Plan: Extubation in OR  Informed Consent: I have reviewed the patients History and Physical, chart, labs and discussed the procedure including the risks, benefits and alternatives for the proposed anesthesia with the patient or authorized representative who has indicated his/her understanding and acceptance.     Dental advisory given  Plan Discussed with: CRNA  Anesthesia Plan Comments:         Anesthesia Quick Evaluation

## 2019-07-16 NOTE — Transfer of Care (Signed)
Immediate Anesthesia Transfer of Care Note  Patient: Christina Brown  Procedure(s) Performed: RIGHT BREAST LUMPECTOMY (Right Breast)  Patient Location: PACU  Anesthesia Type:General  Level of Consciousness: awake  Airway & Oxygen Therapy: Patient Spontanous Breathing and Patient connected to face mask oxygen  Post-op Assessment: Report given to RN and Post -op Vital signs reviewed and stable  Post vital signs: Reviewed and stable  Last Vitals:  Vitals Value Taken Time  BP    Temp    Pulse 84 07/16/19 0825  Resp 15 07/16/19 0825  SpO2 100 % 07/16/19 0825  Vitals shown include unvalidated device data.  Last Pain:  Vitals:   07/16/19 0633  TempSrc: Oral  PainSc: 0-No pain      Patients Stated Pain Goal: 1 ([id number])  Complications: No apparent anesthesia complications

## 2019-07-16 NOTE — Anesthesia Postprocedure Evaluation (Signed)
Anesthesia Post Note  Patient: Christina Brown  Procedure(s) Performed: RIGHT BREAST LUMPECTOMY (Right Breast)     Patient location during evaluation: PACU Anesthesia Type: General Level of consciousness: sedated and patient cooperative Pain management: pain level controlled Vital Signs Assessment: post-procedure vital signs reviewed and stable Respiratory status: spontaneous breathing Cardiovascular status: stable Anesthetic complications: no    Last Vitals:  Vitals:   07/16/19 0900 07/16/19 0930  BP: 119/82 (!) 120/59  Pulse: 80 84  Resp: 13 16  Temp: 36.6 C 36.6 C  SpO2: 91% 94%    Last Pain:  Vitals:   07/16/19 0930  TempSrc:   PainSc: [place]

## 2019-07-16 NOTE — Anesthesia Procedure Notes (Signed)
Procedure Name: LMA Insertion Date/Time: 07/16/2019 7:30 AM Performed by: Lieutenant Diego, CRNA Pre-anesthesia Checklist: Patient identified, Emergency Drugs available, Suction available and Patient being monitored Patient Re-evaluated:Patient Re-evaluated prior to induction Oxygen Delivery Method: Circle system utilized Preoxygenation: Pre-oxygenation with 100% oxygen Induction Type: IV induction Ventilation: Mask ventilation without difficulty LMA: LMA inserted LMA Size: 4.0 Number of attempts: 1 Placement Confirmation: positive ETCO2 and breath sounds checked- equal and bilateral Tube secured with: Tape Dental Injury: Teeth and Oropharynx as per pre-operative assessment

## 2019-07-16 NOTE — H&P (Signed)
History of Present Illness  The patient is a 48 year old female who presents with a breast mass. PCP - Delphina Cahill  This is a 48 year old female referred to me for palpable right breast mass. I excised cyst from her neck last year. The neck incision is well-healed.  This is a healthy 48 year old female who presents with several years of a slowly enlarging mass in her right breast. This was followed with ultrasound and was finally biopsied this year. 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 It causes occasional discomfort. The patient is mildly concerned because it keeps getting larger. She presents now to discuss excision.  Menarche age 1 First pregnancy age 8 Breast-feed node Family history negative for breast cancer Last mesh. Current Hormones oral contraceptives 10 years.  CLINICAL DATA: Interval follow-up of a likely benign mass involving the UPPER OUTER QUADRANT of the RIGHT breast. The patient had been recommended for six-month follow-up and now returns at 14 months. Patient has palpated a lump in this location for approximately 1 month. Annual evaluation, LEFT breast.  EXAM: DIGITAL DIAGNOSTIC BILATERAL MAMMOGRAM WITH CAD AND TOMO  ULTRASOUND RIGHT BREAST  COMPARISON: Previous exam(s).  ACR Breast Density Category c: The breast tissue is heterogeneously dense, which may obscure small masses.  FINDINGS: Tomosynthesis and synthesized full field CC and MLO views of both breasts were obtained. Tomosynthesis and synthesized spot compression tangential view of the area of palpable concern in the RIGHT breast was also obtained.  Interval increase in size of the previously identified mass in the [place] of the RIGHT breast at POSTERIOR depth, now measuring greater than 3 cm, with lobulated margins. There is no associated architectural distortion or suspicious calcifications. No new or suspicious findings elsewhere in the RIGHT breast.  No  findings suspicious for malignancy in the LEFT breast.  Mammographic images were processed with CAD.  On physical exam, there is a mobile palpable approximate 3 cm mass in the UPPER OUTER QUADRANT of the RIGHT breast corresponding to what the patient is feeling.  Targeted RIGHT breast ultrasound is performed, showing a solid hypoechoic mass with irregular margins at the 10 o'clock position approximately 8 cm from the nipple measuring approximately 1.5 x 3.3 x 2.7 cm, demonstrating posterior acoustic enhancement and peripheral internal color Doppler flow, corresponding to the palpable concern and the mammographic finding.  Sonographic evaluation of the RIGHT axilla demonstrates no pathologic lymphadenopathy.  IMPRESSION: 1. Suspicious approximate 3.3 cm mass involving the UPPER OUTER QUADRANT of the RIGHT breast corresponding to the palpable concern. 2. No pathologic RIGHT axillary lymphadenopathy.  RECOMMENDATION: Ultrasound-guided core needle biopsy of the RIGHT breast mass.  The ultrasound core needle biopsy procedure was discussed with the patient and her questions were answered. She has agreed to proceed and the biopsy has been scheduled for February 25 at 8 o'clock a.m.  I have discussed the findings and recommendations with the patient. Results were also provided in writing at the conclusion of the visit.  BI-RADS CATEGORY 4: Suspicious.   Electronically Signed By: Evangeline Dakin M.D. On: 12/18/2018 11:50   CLINICAL DATA: 48 year old female with a suspicious mass in the right breast at the 10 o'clock position.  EXAM: ULTRASOUND GUIDED RIGHT BREAST CORE NEEDLE BIOPSY  COMPARISON: Previous exam(s).  FINDINGS: I met with the patient and we discussed the procedure of ultrasound-guided biopsy, including benefits and alternatives. We discussed the high likelihood of a successful procedure. We discussed the risks of the procedure, including  infection, bleeding, tissue injury, clip  migration, and inadequate sampling. Informed written consent was given. The usual time-out protocol was performed immediately prior to the procedure.  Lesion quadrant: Upper-outer  Using sterile technique and 1% Lidocaine as local anesthetic, under direct ultrasound visualization, a 14 gauge spring-loaded device was used to perform biopsy of the mass in the right breast at the 10 o'clock position using a lateral to medial approach. At the conclusion of the procedure a ribbon shaped tissue marker clip was deployed into the biopsy cavity. Follow up 2 view mammogram was performed and dictated separately.  IMPRESSION: Ultrasound guided biopsy of the mass in the right breast at the 10 o'clock position. No apparent complications.  Electronically Signed: By: Everlean Alstrom M.D. On: 12/25/2018 08:32   ADDENDUM REPORT: 12/26/2018 11:39  ADDENDUM: Pathology of the RIGHT breast biopsy revealed Breast, right, needle core biopsy, right breast 10 o'clock FIBROADENOMA. NO MALIGNANCY IDENTIFIED.  This was found to be concordant by Dr. Shelly Bombard.  Recommendation: Surgical excision due to growth.  At the patient's request, results and recommendations were relayed to the patient by phone by [name], [place] on 12/26/2018 at 11:30 AM. The patient stated she did well following the biopsy with soreness but no bleeding. Post biopsy instructions were reviewed with the patient and all of her questions were answered. She was encouraged to contact the imaging department of [facility] with any further questions or concerns.  Addendum by [name], RRA on 12/26/2018.   Electronically Signed By: Everlean Alstrom M.D. On: 12/26/2018 11:39    Allergies  No Known Drug Allergies [09/10/2018]: Allergies Reconciled  Medication History Escitalopram Oxalate (5MG Tablet, Oral) Active. Atenolol (25MG Tablet, Oral)  Active. methIMAzole (10MG Tablet, Oral) Active. Medications Reconciled    Vitals Weight: 182.13 lb Height: 66in Body Surface Area: 1.92 m Body Mass Index: 29.4 kg/m  Temp.: 95.33F(Oral)  Pulse: 66 (Regular)  P.OX: 96% (Room air) BP: 132/82 (Sitting, Left Arm, Standard)      Physical Exam   The physical exam findings are as follows: Note:WDWN in NAD Eyes: Pupils equal, round; sclera anicteric HENT: Oral mucosa moist; good dentition Neck: No masses palpated, no thyromegaly Lungs: CTA bilaterally; normal respiratory effort Breasts: appear symmetric; no nipple retraction or discharge. No axillary lympadenopathy. Firm palpable 3.5 cm mass in right upper outer quadrant near axilla. Mild bruising from biopsy site. 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 The surgical procedure has been discussed with the patient. Potential risks, benefits, alternative treatments, and expected outcomes have been explained. All of the patient's questions at this time have been answered. The likelihood of reaching the patient's treatment goal is good. The patient understand the proposed surgical procedure and wishes to proceed.  Imogene Burn. Georgette Dover, MD, [facility]  General/ Trauma Surgery Beeper [phone]  07/16/2019 7:04 AM

## 2019-07-16 NOTE — Op Note (Signed)
Pre-op Diagnosis:  Right breast fibroadenoma Post-op Diagnosis: same Procedure:  Right breast lumpectomy Surgeon:  Chapman Matteucci K. Anesthesia:  GEN - LMA Indications: This is a healthy 48 year old female who presents with several years of a slowly enlarging mass in her right breast. This was followed with ultrasound and was finally biopsied this year. 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 It causes occasional discomfort. The patient is mildly concerned because it keeps getting larger. She presents now to discuss excision.  Description of procedure: The patient is brought to the operating room placed in supine position on the operating room table. After an adequate level of general anesthesia was obtained, her right breast was prepped with ChloraPrep and draped in sterile fashion. A timeout was taken to ensure the proper patient and proper procedure. The mass is easily palpable in the RUOQ. We made a circumareolar incision around the lateral side of the nipple after infiltrating with 0.25% Marcaine. Dissection was carried down in the breast tissue with cautery.  We dissected down to the surface of the fibroadenoma.  We excised a 4 x 7 cm firm,smooth mass.  This was sent for pathologic examination.  We inspected carefully for hemostasis. The wound was thoroughly irrigated. The wound was closed with a deep layer of 3-0 Vicryl and a subcuticular layer of 4-0 Monocryl. Benzoin Steri-Strips were applied. The patient was then extubated and brought to the recovery room in stable condition. All sponge, instrument, and needle counts are correct.  Imogene Burn. Georgette Dover, MD, [facility]  General/ Trauma Surgery  07/16/2019 8:20 AM

## 2019-07-16 NOTE — Discharge Instructions (Signed)
[place] Office Phone Number [phone]  BREAST BIOPSY/ PARTIAL MASTECTOMY: POST OP INSTRUCTIONS  Always review your discharge instruction sheet given to you by the facility where your surgery was performed.  IF YOU HAVE DISABILITY OR FAMILY LEAVE FORMS, YOU MUST BRING THEM TO THE OFFICE FOR PROCESSING.  DO NOT GIVE THEM TO YOUR DOCTOR.  1. A prescription for pain medication may be given to you upon discharge.  Take your pain medication as prescribed, if needed.  If narcotic pain medicine is not needed, then you may take acetaminophen (Tylenol) or ibuprofen (Advil) as needed. 2. Take your usually prescribed medications unless otherwise directed 3. If you need a refill on your pain medication, please contact your pharmacy.  They will contact our office to request authorization.  Prescriptions will not be filled after 5pm or on week-ends You should eat very light the first 24 hours after surgery, such as soup, crackers, pudding, etc.  Resume your normal diet the day after surgery. 4. Most patients will experience some swelling and bruising in the breast.  Ice packs and a good support bra will help.  Swelling and bruising can take several days to resolve.  5. It is common to experience some constipation if taking pain medication after surgery.  Increasing fluid intake and taking a stool softener will usually help or prevent this problem from occurring.  A mild laxative (Milk of Magnesia or Miralax) should be taken according to package directions if there are no bowel movements after 48 hours. 6. Unless discharge instructions indicate otherwise, you may remove your bandages 24-48 hours after surgery, and you may shower at that time.  You may have steri-strips (small skin tapes) in place directly over the incision.  These strips should be left on the skin for 7-10 days.  If your surgeon used skin glue on the incision, you may shower in 24 hours.  The glue will flake off over the next  2-3 weeks.  Any sutures or staples will be removed at the office during your follow-up visit. 7. ACTIVITIES:  You may resume regular daily activities (gradually increasing) beginning the next day.  Wearing a good support bra or sports bra minimizes pain and swelling.  You may have sexual intercourse when it is comfortable. a. You may drive when you no longer are taking prescription pain medication, you can comfortably wear a seatbelt, and you can safely maneuver your car and apply brakes. b. RETURN TO WORK:  ______________________________________________________________________________________ 8. You should see your doctor in the office for a follow-up appointment approximately two weeks after your surgery.  Your doctors nurse will typically make your follow-up appointment when she calls you with your pathology report.  Expect your pathology report 2-3 business days after your surgery.  You may call to check if you do not hear from [place] after three days. 9. OTHER INSTRUCTIONS: _______________________________________________________________________________________________ _____________________________________________________________________________________________________________________________________ _____________________________________________________________________________________________________________________________________ _____________________________________________________________________________________________________________________________________  WHEN TO CALL YOUR DOCTOR: 1. Fever over 101.0 2. Nausea and/or vomiting. 3. Extreme swelling or bruising. 4. Continued bleeding from incision. 5. Increased pain, redness, or drainage from the incision.  The clinic staff is available to answer your questions during regular business hours.  Please dont hesitate to call and ask to speak to one of the nurses for clinical concerns.  If you have a medical emergency, go to the nearest emergency  room or call 911.  A surgeon from [facility] is always on call at the hospital.   Post Anesthesia Home Care Instructions  Activity:  Get plenty of rest for the remainder of the day. A responsible individual must stay with you for 24 hours following the procedure.  For the next 24 hours, DO NOT: -Drive a car -Paediatric nurse -Drink alcoholic beverages -Take any medication unless instructed by your physician -Make any legal decisions or sign important papers.  Meals: Start with liquid foods such as gelatin or soup. Progress to regular foods as tolerated. Avoid greasy, spicy, heavy foods. If nausea and/or vomiting occur, drink only clear liquids until the nausea and/or vomiting subsides. Call your physician if vomiting continues.  Special Instructions/Symptoms: Your throat may feel dry or sore from the anesthesia or the breathing tube placed in your throat during surgery. If this causes discomfort, gargle with warm salt water. The discomfort should disappear within 24 hours.  If you had a scopolamine patch placed behind your ear for the management of post- operative nausea and/or vomiting:  1. The medication in the patch is effective for 72 hours, after which it should be removed.  Wrap patch in a tissue and discard in the trash. Wash hands thoroughly with soap and water. 2. You may remove the patch earlier than 72 hours if you experience unpleasant side effects which may include dry mouth, dizziness or visual disturbances. 3. Avoid touching the patch. Wash your hands with soap and water after contact with the patch.  No tylenol until after 12:30 pm.      For further questions, please visit [email or web address]

## 2019-07-17 LAB — SURGICAL PATHOLOGY

## 2019-07-18 ENCOUNTER — Encounter (HOSPITAL_BASED_OUTPATIENT_CLINIC_OR_DEPARTMENT_OTHER): Payer: Self-pay | Admitting: Surgery

## 2019-10-12 ENCOUNTER — Telehealth: Payer: BC Managed Care – PPO

## 2019-10-12 ENCOUNTER — Ambulatory Visit
Admission: EM | Admit: 2019-10-12 | Discharge: 2019-10-12 | Disposition: A | Discharge status: Home / Self Care | Payer: BC Managed Care – PPO | Attending: Emergency Medicine | Admitting: Emergency Medicine

## 2019-10-12 ENCOUNTER — Other Ambulatory Visit: Payer: Self-pay

## 2019-10-12 DIAGNOSIS — L500 Allergic urticaria: Secondary | ICD-10-CM

## 2019-10-12 DIAGNOSIS — L298 Other pruritus: Secondary | ICD-10-CM | POA: Diagnosis not present

## 2019-10-12 DIAGNOSIS — L299 Pruritus, unspecified: Secondary | ICD-10-CM

## 2019-10-12 DIAGNOSIS — L509 Urticaria, unspecified: Secondary | ICD-10-CM

## 2019-10-12 MED ORDER — DEXAMETHASONE SODIUM PHOSPHATE 10 MG/ML IJ SOLN
10.0000 mg | Freq: Once | INTRAMUSCULAR | Status: AC
  Administered 2019-10-12: 10 mg via INTRAMUSCULAR

## 2019-10-12 MED ORDER — PREDNISONE 20 MG PO TABS: 20.0000 mg | ORAL_TABLET | Freq: Two times a day (BID) | ORAL | 0 refills | Status: AC

## 2019-10-12 NOTE — ED Provider Notes (Signed)
[place]   [id number] 10/12/19 Arrival Time: [id number]  Cc: Hives  SUBJECTIVE:  Christina Brown is a 48 y.o. female who presents with rash that began overnight.  Denies precipitating event, exposure, known allergy or trigger. Denies changes in medication or starting a new medication.  Denies close contacts with similar rash.  Has tried 1000 mg of allegra with temporary relief.  Symptoms are made worse with scratching.  Denies previous symptoms in the past.   Denies fever, chills, nausea, vomiting, swollen glands, oral manifestations such as throat swelling/ tingling, mouth swelling/ tingling, tongue swelling/tingling, dyspnea, SOB, chest pain, abdominal pain, changes in bowel or bladder function, bleeding gums, sores in mouth or on genitals.  ROS: As per HPI.  All other pertinent ROS negative.     Past Medical History:  Diagnosis Date  . Anxiety   . Depression   . Hyperthyroidism    controlled with meds  . Sebaceous cyst 09/2018   Right posterior neck   Past Surgical History:  Procedure Laterality Date  . BREAST LUMPECTOMY Right 07/16/2019   Procedure: RIGHT BREAST LUMPECTOMY;  Surgeon: Donnie Mesa, MD;  Location: [place];  Service: General;  Laterality: Right;  . BREAST LUMPECTOMY    . MASS EXCISION Right 10/11/2018   Procedure: EXCISION OF SEBACEOUS CYST - RIGHT POSTERIOR NECK;  Surgeon: Donnie Mesa, MD;  Location: [place];  Service: General;  Laterality: Right;  . MOUTH SURGERY      2019 in dental office  . MOUTH SURGERY     No Known Allergies No current facility-administered medications on file prior to encounter.   Current Outpatient Medications on File Prior to Encounter  Medication Sig Dispense Refill  . b complex vitamins capsule Take 1 capsule by mouth daily.    Marland Kitchen escitalopram (LEXAPRO) 5 MG tablet Take 5 mg by mouth daily.    Marland Kitchen HYDROcodone-acetaminophen (NORCO/VICODIN) 5-325 MG tablet Take 1 tablet by mouth every 6  (six) hours as needed for moderate pain. 15 tablet 0  . methimazole (TAPAZOLE) 10 MG tablet Take 10 mg by mouth 3 (three) times daily.    . [DISCONTINUED] atenolol (TENORMIN) 25 MG tablet Take by mouth daily.      Social History   Socioeconomic History  . Marital status: Married    Spouse name: Not on file  . Number of children: Not on file  . Years of education: Not on file  . Highest education level: Not on file  Occupational History  . Not on file  Tobacco Use  . Smoking status: Former Smoker    Quit date: 05/2017    Years since quitting: 2.3  . Smokeless tobacco: Never Used  Substance and Sexual Activity  . Alcohol use: Not Currently  . Drug use: Never  . Sexual activity: Yes    Birth control/protection: Other-see comments    Comment: husband had vasectomy  Other Topics Concern  . Not on file  Social History Narrative  . Not on file   Social Determinants of Health   Financial Resource Strain:   . Difficulty of Paying Living Expenses: Not on file  Food Insecurity:   . Worried About Charity fundraiser in the Last Year: Not on file  . Ran Out of Food in the Last Year: Not on file  Transportation Needs:   . Lack of Transportation (Medical): Not on file  . Lack of Transportation (Non-Medical): Not on file  Physical Activity:   . Days of Exercise  per Week: Not on file  . Minutes of Exercise per Session: Not on file  Stress:   . Feeling of Stress : Not on file  Social Connections:   . Frequency of Communication with Friends and Family: Not on file  . Frequency of Social Gatherings with Friends and Family: Not on file  . Attends Religious Services: Not on file  . Active Member of Clubs or Organizations: Not on file  . Attends Archivist Meetings: Not on file  . Marital Status: Not on file  Intimate Partner Violence:   . Fear of Current or Ex-Partner: Not on file  . Emotionally Abused: Not on file  . Physically Abused: Not on file  . Sexually Abused: Not  on file   Family History  Problem Relation Age of Onset  . Diabetes Mother   . Heart disease Mother   . Kidney disease Mother   . Melanoma Father   . Diabetes Sister   . Heart disease Sister   . Lung disease Sister      OBJECTIVE:  Vitals:   10/12/19 1453  BP: 125/88  Pulse: 79  Resp: 16  Temp: 98.2 F (36.8 C)  TempSrc: Oral  SpO2: 95%     General appearance: Alert, speaking in full sentences without difficulty HEENT:NCAT; no obvious facial swelling; Ears: EACs clear, TMs pearly gray; Eyes: PERRL.  EOM grossly intact. Nose: nares patent without rhinorrhea; Throat: tonsils nonerythematous or enlarged, uvula midline Neck: supple without LAD Lungs: clear to auscultation bilaterally without adventitious breath sounds; normal respiratory effort; no labored respirations Heart: regular rate and rhythm.   Skin: warm and dry; smooth, slightly elevated and erythematous plaques of variable size over her face, trunk, and bilateral UEs; NTTP, blanches with pressure, no obvious bleeding or discharge Psychological: alert and cooperative; normal mood and affect  ASSESSMENT & PLAN:  1. Urticaria   2. Itching     Meds ordered this encounter  Medications  . predniSONE (DELTASONE) 20 MG tablet    Sig: Take 1 tablet (20 mg total) by mouth 2 (two) times daily with a meal for 5 days.    Dispense:  10 tablet    Refill:  0    Order Specific Question:   Supervising Provider    Answer:   Raylene Everts [id number]  . dexamethasone (DECADRON) injection 10 mg   Decadron 10 mg shot given in office Prednisone prescribed.  Take as directed and to completion Continue with benadryl and/or other OTC antihistamines like allegra for daytime itching Follow up with PCP as needed Return sooner or go to the ED if you have any new or worsening symptoms such as difficulty breathing, shortness of breath, chest pain, nausea, vomiting, throat tightness or swelling, tongue swelling or tingling, worsening  lip or facial swelling, abdominal pain, changes in bowel or bladder habits, no improvement despite medications, etc...   Reviewed expectations re: course of current medical issues. Questions answered. Outlined signs and symptoms indicating need for more acute intervention. Patient verbalized understanding. After Visit Summary given.          Lestine Box, PA-C 10/12/19 1510

## 2019-10-12 NOTE — Discharge Instructions (Signed)
Decadron 10 mg shot given in office Prednisone prescribed.  Take as directed and to completion Continue with benadryl and/or other OTC antihistamines like allegra for daytime itching Follow up with PCP as needed Return sooner or go to the ED if you have any new or worsening symptoms such as difficulty breathing, shortness of breath, chest pain, nausea, vomiting, throat tightness or swelling, tongue swelling or tingling, worsening lip or facial swelling, abdominal pain, changes in bowel or bladder habits, no improvement despite medications, etc..Marland Kitchen

## 2019-10-12 NOTE — ED Triage Notes (Signed)
Pt presents to UC w/ c/o itchy hives/rash on her torso, extremeties, and head starting last night. Pt took allegra at 1000 today. Pt states she did not come into contact w/ any new to cause this reaction.

## 2020-02-24 ENCOUNTER — Ambulatory Visit: Payer: BC Managed Care – PPO | Admitting: Allergy and Immunology

## 2020-02-24 ENCOUNTER — Other Ambulatory Visit: Payer: Self-pay

## 2020-02-24 ENCOUNTER — Encounter: Payer: Self-pay | Admitting: Allergy and Immunology

## 2020-02-24 VITALS — BP 108/70 | HR 88 | Temp 98.7°F | Resp 16 | Ht 65.5 in | Wt 191.0 lb

## 2020-02-24 DIAGNOSIS — T783XXD Angioneurotic edema, subsequent encounter: Secondary | ICD-10-CM

## 2020-02-24 DIAGNOSIS — T7840XD Allergy, unspecified, subsequent encounter: Secondary | ICD-10-CM | POA: Diagnosis not present

## 2020-02-24 DIAGNOSIS — K2970 Gastritis, unspecified, without bleeding: Secondary | ICD-10-CM | POA: Diagnosis not present

## 2020-02-24 DIAGNOSIS — T783XXA Angioneurotic edema, initial encounter: Secondary | ICD-10-CM | POA: Insufficient documentation

## 2020-02-24 DIAGNOSIS — L500 Allergic urticaria: Secondary | ICD-10-CM | POA: Diagnosis not present

## 2020-02-24 DIAGNOSIS — T7840XA Allergy, unspecified, initial encounter: Secondary | ICD-10-CM | POA: Insufficient documentation

## 2020-02-24 HISTORY — DX: Angioneurotic edema, initial encounter: T78.3XXA

## 2020-02-24 MED ORDER — LEVOCETIRIZINE DIHYDROCHLORIDE 5 MG PO TABS: 5.0000 mg | ORAL_TABLET | Freq: Every day | ORAL | 5 refills | Status: DC | PRN

## 2020-02-24 MED ORDER — FAMOTIDINE 20 MG PO TABS: 20.0000 mg | ORAL_TABLET | Freq: Two times a day (BID) | ORAL | 5 refills | Status: DC | PRN

## 2020-02-24 NOTE — Assessment & Plan Note (Signed)
Unclear etiology. Skin tests to select food allergens were negative today. NSAIDs and emotional stress commonly exacerbate urticaria but are not the underlying etiology in this case. Physical urticarias are negative by history (i.e. pressure-induced, temperature, vibration, solar, etc.).  There are no concomitant symptoms concerning for anaphylaxis. We will rule out other potential etiologies with labs. For symptom relief, patient is to take oral antihistamines as directed.  The following labs have been ordered: FCeRI antibody, anti-thyroglobulin antibody, thyroid peroxidase antibody, tryptase, H. pylori serology, CBC, CMP, ESR, ANA, and alpha-gal panel.  The patient will be called with further recommendations after lab results have returned.  Instructions have been discussed and provided for H1/H2 receptor blockade with titration to find lowest effective dose.  A prescription has been provided for levocetirizine (Xyzal), 5 mg daily as needed.  To avoid diminishing benefit with daily use (tachyphylaxis) of second generation antihistamine, consider alternating every few months between fexofenadine (Allegra) and levocetirizine (Xyzal).  A prescription has been provided for famotidine (Pepcid), 42m twice daily as needed.  Should there be a significant increase or change in symptoms, a journal is to be kept recording any foods eaten, beverages consumed, medications taken within a 6 hour period prior to the onset of symptoms, as well as record activities being performed, and environmental conditions. For any symptoms concerning for anaphylaxis, 911 is to be called immediately.

## 2020-02-24 NOTE — Patient Instructions (Addendum)
Recurrent urticaria Unclear etiology. Skin tests to select food allergens were negative today. NSAIDs and emotional stress commonly exacerbate urticaria but are not the underlying etiology in this case. Physical urticarias are negative by history (i.e. pressure-induced, temperature, vibration, solar, etc.).  There are no concomitant symptoms concerning for anaphylaxis. We will rule out other potential etiologies with labs. For symptom relief, patient is to take oral antihistamines as directed.  The following labs have been ordered: FCeRI antibody, anti-thyroglobulin antibody, thyroid peroxidase antibody, tryptase, H. pylori serology, CBC, CMP, ESR, ANA, and alpha-gal panel.  The patient will be called with further recommendations after lab results have returned.  Instructions have been discussed and provided for H1/H2 receptor blockade with titration to find lowest effective dose.  A prescription has been provided for levocetirizine (Xyzal), 5 mg daily as needed.  To avoid diminishing benefit with daily use (tachyphylaxis) of second generation antihistamine, consider alternating every few months between fexofenadine (Allegra) and levocetirizine (Xyzal).  A prescription has been provided for famotidine (Pepcid), 27m twice daily as needed.  Should there be a significant increase or change in symptoms, a journal is to be kept recording any foods eaten, beverages consumed, medications taken within a 6 hour period prior to the onset of symptoms, as well as record activities being performed, and environmental conditions. For any symptoms concerning for anaphylaxis, 911 is to be called immediately.  Angioedema Associated angioedema occurs in up to 50% of patients with chronic urticaria.  Treatment/diagnostic plan as outlined above.   When lab results have returned you will be called with further recommendations. With the newly implemented Cures Act, the labs may be visible to you at the same time  they become visible to [place] However, the results will typically not be addressed until all of the results are back, so please be patient.  Until you have heard from [place] please continue the treatment plan as outlined on your take home sheet.  Urticaria (Hives)  . Fexofenadine (Allegra) 180 mg twice a day and famotidine (Pepcid) 20 mg twice a day. If no symptoms for 7-14 days then decrease to. .Marland KitchenFexofenadine (Allegra) 180 mg twice a day and famotidine (Pepcid) 20 mg once a day.  If no symptoms for 7-14 days then decrease to. .Marland KitchenFexofenadine (Allegra) 180 mg twice a day.  If no symptoms for 7-14 days then decrease to. .Marland KitchenFexofenadine (Allegra) 180 mg once a day.  May use Benadryl (diphenhydramine) as needed for breakthrough symptoms       If symptoms return, then step up dosage

## 2020-02-24 NOTE — Progress Notes (Signed)
New Patient Note  RE: Christina Brown MRN: [id number] DOB: [birth year] Date of Office Visit: 02/24/2020  Referring provider: Celene Squibb, MD Primary care provider: Pablo Lawrence, NP  Chief Complaint: Urticaria and Angioedema   History of present illness: Christina Brown is a 49 y.o. female seen today in consultation requested by Allyn Kenner, MD.  Since late November 2020, Christina Brown has experienced recurrent episodes of hives. The hives have appeared at different times over her entire body.  The lesions are described as erythematous, raised, and pruritic.  At times, the hives are large, at other times they are pinpoint hives.  Individual hives last less than 24 hours without leaving residual pigmentation or bruising. She has experienced associated angioedema of the eyelids and earlobes and hands (slightly) but denies concomitant cardiopulmonary or GI symptoms. She has not experienced unexpected weight loss, recurrent fevers or drenching night sweats. Seems to vary with the menstrual cycle. Otherwise, no specific medication, food, skin care product, detergent, soap, or other environmental triggers have been identified. The symptoms do not seem to correlate with NSAIDs use or emotional stress. She did not have symptoms consistent with a respiratory tract infection at the time of symptom onset. Christina Brown has tried to control symptoms with OTC antihistamines which have offered moderate temporary relief. She has been evaluated and treated in the urgent care department for these symptoms in December 2020 because she had facial swelling and "full-blown hives." Skin biopsy has not been performed. Recently, she has been experiencing hives daily if not taking antihistamines. Christina Brown has no history of symptoms consistent with allergic rhinitis, asthma, or atopic dermatitis.  Assessment and plan: Recurrent urticaria Unclear etiology. Skin tests to select food allergens were negative today. NSAIDs and emotional  stress commonly exacerbate urticaria but are not the underlying etiology in this case. Physical urticarias are negative by history (i.e. pressure-induced, temperature, vibration, solar, etc.).  There are no concomitant symptoms concerning for anaphylaxis. We will rule out other potential etiologies with labs. For symptom relief, patient is to take oral antihistamines as directed.  The following labs have been ordered: FCeRI antibody, anti-thyroglobulin antibody, thyroid peroxidase antibody, tryptase, H. pylori serology, CBC, CMP, ESR, ANA, and alpha-gal panel.  The patient will be called with further recommendations after lab results have returned.  Instructions have been discussed and provided for H1/H2 receptor blockade with titration to find lowest effective dose.  A prescription has been provided for levocetirizine (Xyzal), 5 mg daily as needed.  To avoid diminishing benefit with daily use (tachyphylaxis) of second generation antihistamine, consider alternating every few months between fexofenadine (Allegra) and levocetirizine (Xyzal).  A prescription has been provided for famotidine (Pepcid), 52m twice daily as needed.  Should there be a significant increase or change in symptoms, a journal is to be kept recording any foods eaten, beverages consumed, medications taken within a 6 hour period prior to the onset of symptoms, as well as record activities being performed, and environmental conditions. For any symptoms concerning for anaphylaxis, 911 is to be called immediately.  Angioedema Associated angioedema occurs in up to 50% of patients with chronic urticaria.  Treatment/diagnostic plan as outlined above.   Meds ordered this encounter  Medications  . levocetirizine (XYZAL) 5 MG tablet    Sig: Take 1 tablet (5 mg total) by mouth daily as needed for allergies.    Dispense:  60 tablet    Refill:  5  . famotidine (PEPCID) 20 MG tablet    Sig: Take 1 tablet (20  mg total) by mouth 2  (two) times daily as needed for heartburn or indigestion.    Dispense:  60 tablet    Refill:  5    Diagnostics: Environmental skin testing: Negative despite a positive histamine control. Food allergen skin testing: Negative despite a positive histamine control.    Physical examination: Blood pressure 108/70, pulse 88, temperature 98.7 F (37.1 C), temperature source Temporal, resp. rate 16, height 5' 5.5" (1.664 m), weight 191 lb (86.6 kg), SpO2 97 %.  General: Alert, interactive, in no acute distress. HEENT: TMs pearly gray, turbinates mildly edematous without discharge, post-pharynx mildly erythematous. Neck: Supple without lymphadenopathy. Lungs: Clear to auscultation without wheezing, rhonchi or rales. CV: Normal S1, S2 without murmurs. Abdomen: Nondistended, nontender. Skin: Scattered erythematous pinpoint papules primarily located on her forearms bilaterally , nonvesicular. Extremities:  No clubbing, cyanosis or edema. Neuro:   Grossly intact.  Review of systems:  Review of systems negative except as noted in HPI / PMHx or noted below: Review of Systems  Constitutional: Negative.   HENT: Negative.   Eyes: Negative.   Respiratory: Negative.   Cardiovascular: Negative.   Gastrointestinal: Negative.   Genitourinary: Negative.   Musculoskeletal: Negative.   Skin: Negative.   Neurological: Negative.   Endo/Heme/Allergies: Negative.   Psychiatric/Behavioral: Negative.     Past medical history:  Past Medical History:  Diagnosis Date  . Angioedema 02/24/2020  . Anxiety   . Depression   . Hyperthyroidism    controlled with meds  . Sebaceous cyst 09/2018   Right posterior neck  . Urticaria     Past surgical history:  Past Surgical History:  Procedure Laterality Date  . BREAST LUMPECTOMY Right 07/16/2019   Procedure: RIGHT BREAST LUMPECTOMY;  Surgeon: Donnie Mesa, MD;  Location: [facility];  Service: General;  Laterality: Right;  . BREAST  LUMPECTOMY    . MASS EXCISION Right 10/11/2018   Procedure: EXCISION OF SEBACEOUS CYST - RIGHT POSTERIOR NECK;  Surgeon: Donnie Mesa, MD;  Location: [facility];  Service: General;  Laterality: Right;  . MOUTH SURGERY      2019 in dental office  . MOUTH SURGERY      Family history: Family History  Problem Relation Age of Onset  . Diabetes Mother   . Heart disease Mother   . Kidney disease Mother   . Melanoma Father   . Diabetes Sister   . Heart disease Sister   . Lung disease Sister   . Angioedema Neg Hx   . Allergic rhinitis Neg Hx   . Asthma Neg Hx   . Atopy Neg Hx   . Eczema Neg Hx   . Immunodeficiency Neg Hx   . Urticaria Neg Hx     Social history: Social History   Socioeconomic History  . Marital status: Married    Spouse name: Not on file  . Number of children: Not on file  . Years of education: Not on file  . Highest education level: Not on file  Occupational History  . Not on file  Tobacco Use  . Smoking status: Former Smoker    Quit date: 05/2017    Years since quitting: 2.7  . Smokeless tobacco: Never Used  Substance and Sexual Activity  . Alcohol use: Not Currently  . Drug use: Never  . Sexual activity: Yes    Birth control/protection: Other-see comments    Comment: husband had vasectomy  Other Topics Concern  . Not on file  Social History Narrative  .  Not on file   Social Determinants of Health   Financial Resource Strain:   . Difficulty of Paying Living Expenses:   Food Insecurity:   . Worried About Charity fundraiser in the Last Year:   . Arboriculturist in the Last Year:   Transportation Needs:   . Film/video editor (Medical):   Marland Kitchen Lack of Transportation (Non-Medical):   Physical Activity:   . Days of Exercise per Week:   . Minutes of Exercise per Session:   Stress:   . Feeling of Stress :   Social Connections:   . Frequency of Communication with Friends and Family:   . Frequency of Social Gatherings with  Friends and Family:   . Attends Religious Services:   . Active Member of Clubs or Organizations:   . Attends Archivist Meetings:   Marland Kitchen Marital Status:   Intimate Partner Violence:   . Fear of Current or Ex-Partner:   . Emotionally Abused:   Marland Kitchen Physically Abused:   . Sexually Abused:     Environmental History: The patient lives in a 49 year old house with hardwood floors throughout, gassy, and central air.  There is no known mold/water damage in the home.  There are cats in the home which have access to her bedroom.  She is a former cigarette smoker having quit in August 2018.  Current Outpatient Medications  Medication Sig Dispense Refill  . cetirizine (ZYRTEC) 10 MG tablet Take 10 mg by mouth 2 (two) times daily.    Marland Kitchen escitalopram (LEXAPRO) 5 MG tablet Take 5 mg by mouth daily.    . famotidine (PEPCID) 20 MG tablet Take 1 tablet (20 mg total) by mouth 2 (two) times daily as needed for heartburn or indigestion. 60 tablet 5  . levocetirizine (XYZAL) 5 MG tablet Take 1 tablet (5 mg total) by mouth daily as needed for allergies. 60 tablet 5   No current facility-administered medications for this visit.    Known medication allergies: No Known Allergies  I appreciate the opportunity to take part in Christina Brown's care. Please do not hesitate to contact me with questions.  Sincerely,   R. Edgar Frisk, MD

## 2020-02-24 NOTE — Assessment & Plan Note (Signed)
Associated angioedema occurs in up to 50% of patients with chronic urticaria.  Treatment/diagnostic plan as outlined above. 

## 2020-02-28 LAB — HELICOBACTER PYLORI ABS-IGG+IGA, BLD
H. pylori, IgA Abs: 19.6 units — ABNORMAL HIGH (ref 0.0–8.9)
H. pylori, IgG AbS: 2.99 Index Value — ABNORMAL HIGH (ref 0.00–0.79)

## 2020-02-28 LAB — CBC WITH DIFFERENTIAL/PLATELET
Basophils Absolute: 0 10*3/uL (ref 0.0–0.2)
Basos: 0 %
EOS (ABSOLUTE): 0.1 10*3/uL (ref 0.0–0.4)
Eos: 1 %
Hematocrit: 41.8 % (ref 34.0–46.6)
Hemoglobin: 13.9 g/dL (ref 11.1–15.9)
Immature Grans (Abs): 0 10*3/uL (ref 0.0–0.1)
Immature Granulocytes: 0 %
Lymphocytes Absolute: 2.3 10*3/uL (ref 0.7–3.1)
Lymphs: 33 %
MCH: 29.4 pg (ref 26.6–33.0)
MCHC: 33.3 g/dL (ref 31.5–35.7)
MCV: 89 fL (ref 79–97)
Monocytes Absolute: 0.4 10*3/uL (ref 0.1–0.9)
Monocytes: 6 %
Neutrophils Absolute: 4.1 10*3/uL (ref 1.4–7.0)
Neutrophils: 60 %
Platelets: 318 10*3/uL (ref 150–450)
RBC: 4.72 x10E6/uL (ref 3.77–5.28)
RDW: 12 % (ref 11.7–15.4)
WBC: 7 10*3/uL (ref 3.4–10.8)

## 2020-02-28 LAB — COMPREHENSIVE METABOLIC PANEL
ALT: 10 IU/L (ref 0–32)
AST: 17 IU/L (ref 0–40)
Albumin/Globulin Ratio: 1.5 (ref 1.2–2.2)
Albumin: 4.5 g/dL (ref 3.8–4.8)
Alkaline Phosphatase: 66 IU/L (ref 39–117)
BUN/Creatinine Ratio: 13 (ref 9–23)
BUN: 10 mg/dL (ref 6–24)
Bilirubin Total: 0.3 mg/dL (ref 0.0–1.2)
CO2: 25 mmol/L (ref 20–29)
Calcium: 9.3 mg/dL (ref 8.7–10.2)
Chloride: 103 mmol/L (ref 96–106)
Creatinine, Ser: 0.78 mg/dL (ref 0.57–1.00)
GFR calc Af Amer: 103 mL/min/{1.73_m2} (ref >59)
GFR calc non Af Amer: 90 mL/min/{1.73_m2} (ref >59)
Globulin, Total: 3 g/dL (ref 1.5–4.5)
Glucose: 81 mg/dL (ref 65–99)
Potassium: 4.5 mmol/L (ref 3.5–5.2)
Sodium: 140 mmol/L (ref 134–144)
Total Protein: 7.5 g/dL (ref 6.0–8.5)

## 2020-02-28 LAB — THYROID PEROXIDASE ANTIBODY: Thyroperoxidase Ab SerPl-aCnc: <9 IU/mL (ref 0–34)

## 2020-02-28 LAB — SEDIMENTATION RATE: Sed Rate: 14 mm/hr (ref 0–32)

## 2020-02-28 LAB — THYROGLOBULIN ANTIBODY: Thyroglobulin Antibody: 1 IU/mL — ABNORMAL HIGH (ref 0.0–0.9)

## 2020-02-28 LAB — ALPHA-GAL PANEL
Alpha Gal IgE*: <0.1 kU/L (ref <0.10)
Beef (Bos spp) IgE: <0.1 kU/L (ref <0.35)
Class Interpretation: 0
Class Interpretation: 0
Class Interpretation: 0
Lamb/Mutton (Ovis spp) IgE: <0.1 kU/L (ref <0.35)
Pork (Sus spp) IgE: <0.1 kU/L (ref <0.35)

## 2020-02-28 LAB — TRYPTASE: Tryptase: 9.1 ug/L (ref 2.2–13.2)

## 2020-02-28 LAB — CHRONIC URTICARIA: cu index: 8.2 (ref <10)

## 2020-02-28 LAB — ANA W/REFLEX: Anti Nuclear Antibody (ANA): NEGATIVE

## 2020-03-02 ENCOUNTER — Telehealth: Payer: Self-pay | Admitting: *Deleted

## 2020-03-02 NOTE — Telephone Encounter (Signed)
error 

## 2020-03-03 ENCOUNTER — Other Ambulatory Visit: Payer: Self-pay | Admitting: *Deleted

## 2020-03-03 MED ORDER — OMEPRAZOLE 20 MG PO CPDR: 20.0000 mg | DELAYED_RELEASE_CAPSULE | Freq: Two times a day (BID) | ORAL | 0 refills | Status: DC

## 2020-03-03 MED ORDER — AMOXICILLIN-POT CLAVULANATE ER 1000-62.5 MG PO TB12: 1.0000 | ORAL_TABLET | Freq: Two times a day (BID) | ORAL | 0 refills | Status: AC

## 2020-03-03 MED ORDER — CLARITHROMYCIN 500 MG PO TABS: 500.0000 mg | ORAL_TABLET | Freq: Two times a day (BID) | ORAL | 0 refills | Status: AC

## 2020-03-03 MED ORDER — AMOXICILLIN-POT CLAVULANATE ER 1000-62.5 MG PO TB12: 2.0000 | ORAL_TABLET | Freq: Two times a day (BID) | ORAL | 0 refills | Status: AC

## 2020-10-16 ENCOUNTER — Other Ambulatory Visit (HOSPITAL_COMMUNITY): Payer: Self-pay | Admitting: Internal Medicine

## 2020-10-16 DIAGNOSIS — Z1231 Encounter for screening mammogram for malignant neoplasm of breast: Secondary | ICD-10-CM

## 2020-10-23 ENCOUNTER — Other Ambulatory Visit: Payer: Self-pay

## 2020-10-23 ENCOUNTER — Ambulatory Visit
Admission: EM | Admit: 2020-10-23 | Discharge: 2020-10-23 | Disposition: A | Discharge status: Home / Self Care | Payer: BC Managed Care – PPO | Attending: Family Medicine | Admitting: Family Medicine

## 2020-10-23 ENCOUNTER — Encounter: Payer: Self-pay | Admitting: Emergency Medicine

## 2020-10-23 DIAGNOSIS — R52 Pain, unspecified: Secondary | ICD-10-CM

## 2020-10-23 DIAGNOSIS — R197 Diarrhea, unspecified: Secondary | ICD-10-CM | POA: Diagnosis present

## 2020-10-23 DIAGNOSIS — R519 Headache, unspecified: Secondary | ICD-10-CM | POA: Diagnosis present

## 2020-10-23 DIAGNOSIS — R5383 Other fatigue: Secondary | ICD-10-CM | POA: Diagnosis present

## 2020-10-23 DIAGNOSIS — B349 Viral infection, unspecified: Secondary | ICD-10-CM | POA: Diagnosis present

## 2020-10-23 DIAGNOSIS — R059 Cough, unspecified: Secondary | ICD-10-CM | POA: Diagnosis present

## 2020-10-23 LAB — POCT RAPID STREP A (OFFICE): Rapid Strep A Screen: NEGATIVE

## 2020-10-23 NOTE — Discharge Instructions (Signed)
May drink fluids with added electrolytes to replace losses from diarrhea  May take imodium for diarrhea  Your COVID and Flu tests are pending.  You should self quarantine until the test results are back.    Take Tylenol or ibuprofen as needed for fever or discomfort.  Rest and keep yourself hydrated.    Follow-up with your primary care provider if your symptoms are not improving.

## 2020-10-23 NOTE — ED Provider Notes (Signed)
[facility]   [id number] 10/23/20 Arrival Time: [id number]   CC: COVID symptoms  SUBJECTIVE: History from: patient.  Christina Brown is a 49 y.o. female who presents with abrupt onset of headache, ear pain, lymphadenopathy, sore throat and diarrhea x 2 days. Denies sick exposure to COVID, flu or strep. Denies recent travel. Has negative history of Covid. Has not completed Covid vaccines. Has taken tylenol for headache with temporary relief. There are no aggravating or alleviating factors. Denies previous symptoms in the past. Denies fever, chills, fatigue, sinus pain, rhinorrhea,  SOB, wheezing, chest pain, nausea, changes in  bladder habits.    ROS: As per HPI.  All other pertinent ROS negative.     Past Medical History:  Diagnosis Date  . Angioedema 02/24/2020  . Anxiety   . Depression   . Hyperthyroidism    controlled with meds  . Sebaceous cyst 09/2018   Right posterior neck  . Urticaria    Past Surgical History:  Procedure Laterality Date  . BREAST LUMPECTOMY Right 07/16/2019   Procedure: RIGHT BREAST LUMPECTOMY;  Surgeon: Donnie Mesa, MD;  Location: [facility];  Service: General;  Laterality: Right;  . BREAST LUMPECTOMY    . MASS EXCISION Right 10/11/2018   Procedure: EXCISION OF SEBACEOUS CYST - RIGHT POSTERIOR NECK;  Surgeon: Donnie Mesa, MD;  Location: [facility];  Service: General;  Laterality: Right;  . MOUTH SURGERY      2019 in dental office  . MOUTH SURGERY     Allergies  Allergen Reactions  . Ibuprofen Shortness Of Breath and Rash   No current facility-administered medications on file prior to encounter.   Current Outpatient Medications on File Prior to Encounter  Medication Sig Dispense Refill  . cetirizine (ZYRTEC) 10 MG tablet Take 10 mg by mouth 2 (two) times daily.    Marland Kitchen escitalopram (LEXAPRO) 5 MG tablet Take 5 mg by mouth daily.    . famotidine (PEPCID) 20 MG tablet Take 1 tablet (20 mg total) by mouth 2  (two) times daily as needed for heartburn or indigestion. 60 tablet 5  . levocetirizine (XYZAL) 5 MG tablet Take 1 tablet (5 mg total) by mouth daily as needed for allergies. 60 tablet 5  . omeprazole (PRILOSEC) 20 MG capsule Take 1 capsule (20 mg total) by mouth in the morning and at bedtime for 14 days. 30 capsule 0  . [DISCONTINUED] atenolol (TENORMIN) 25 MG tablet Take by mouth daily.     Social History   Socioeconomic History  . Marital status: Married    Spouse name: Not on file  . Number of children: Not on file  . Years of education: Not on file  . Highest education level: Not on file  Occupational History  . Not on file  Tobacco Use  . Smoking status: Former Smoker    Quit date: 05/2017    Years since quitting: 3.4  . Smokeless tobacco: Never Used  Vaping Use  . Vaping Use: Never used  Substance and Sexual Activity  . Alcohol use: Not Currently  . Drug use: Never  . Sexual activity: Yes    Birth control/protection: Other-see comments    Comment: husband had vasectomy  Other Topics Concern  . Not on file  Social History Narrative  . Not on file   Social Determinants of Health   Financial Resource Strain: Not on file  Food Insecurity: Not on file  Transportation Needs: Not on file  Physical Activity: Not on  file  Stress: Not on file  Social Connections: Not on file  Intimate Partner Violence: Not on file   Family History  Problem Relation Age of Onset  . Diabetes Mother   . Heart disease Mother   . Kidney disease Mother   . Melanoma Father   . Diabetes Sister   . Heart disease Sister   . Lung disease Sister   . Angioedema Neg Hx   . Allergic rhinitis Neg Hx   . Asthma Neg Hx   . Atopy Neg Hx   . Eczema Neg Hx   . Immunodeficiency Neg Hx   . Urticaria Neg Hx     OBJECTIVE:  Vitals:   10/23/20 1314 10/23/20 1316  BP:  137/89  Pulse:  74  Resp:  18  Temp:  98.5 F (36.9 C)  TempSrc:  Oral  SpO2:  95%  Weight: 192 lb (87.1 kg)   Height: 5'  6" (1.676 m)      General appearance: alert; appears fatigued, but nontoxic; speaking in full sentences and tolerating own secretions HEENT: NCAT; Ears: EACs clear, TMs pearly gray; Eyes: PERRL.  EOM grossly intact. Sinuses: nontender; Nose: nares patent without rhinorrhea, Throat: oropharynx mildly erythematous, cobblestoning present tonsils non erythematous or enlarged, uvula midline  Neck: supple with LAD Lungs: unlabored respirations, symmetrical air entry; cough: absent; no respiratory distress; CTAB Heart: regular rate and rhythm.  Radial pulses 2+ symmetrical bilaterally Skin: warm and dry Psychological: alert and cooperative; normal mood and affect  LABS:  No results found for this or any previous visit (from the past 24 hour(s)).   ASSESSMENT & PLAN:  1. Viral illness   2. Generalized body aches   3. Cough   4. Nonintractable headache, unspecified chronicity pattern, unspecified headache type   5. Diarrhea, unspecified type   6. Other fatigue    Continue supportive care at home Increase fluids to help replace losses Rapid strep negative in office today Will culture and will inform patient of positive results that require further treatment Most likely viral in origin COVID and flu testing ordered.  It will take between 1-2 days for test results.  Someone will contact you regarding abnormal results.   Work note provided Patient should remain in quarantine until they have received Covid results.  If negative you may resume normal activities (go back to work/school) while practicing hand hygiene, social distance, and mask wearing.  If positive, patient should remain in quarantine for 10 days from symptom onset AND greater than 72 hours after symptoms resolution (absence of fever without the use of fever-reducing medication and improvement in respiratory symptoms), whichever is longer Get plenty of rest and push fluids Use OTC zyrtec for nasal congestion, runny nose, and/or sore  throat Use OTC flonase for nasal congestion and runny nose Use medications daily for symptom relief Use OTC medications like ibuprofen or tylenol as needed fever or pain Call or go to the ED if you have any new or worsening symptoms such as fever, worsening cough, shortness of breath, chest tightness, chest pain, turning blue, changes in mental status.  Reviewed expectations re: course of current medical issues. Questions answered. Outlined signs and symptoms indicating need for more acute intervention. Patient verbalized understanding. After Visit Summary given.         Faustino Congress, NP 10/26/20 [phone]

## 2020-10-23 NOTE — ED Triage Notes (Signed)
Headache, ear pain, neck pain, sore throat and diarrhea x 2 days

## 2020-10-26 LAB — CULTURE, GROUP A STREP (THRC)

## 2020-10-27 LAB — COVID-19, FLU A+B NAA
Influenza A, NAA: NOT DETECTED
Influenza B, NAA: NOT DETECTED
SARS-CoV-2, NAA: NOT DETECTED

## 2020-10-28 ENCOUNTER — Telehealth: Payer: Self-pay | Admitting: Family Medicine

## 2020-10-28 DIAGNOSIS — J069 Acute upper respiratory infection, unspecified: Secondary | ICD-10-CM

## 2020-10-28 MED ORDER — PREDNISONE 10 MG PO TABS: 20.0000 mg | ORAL_TABLET | Freq: Every day | ORAL | 0 refills | Status: AC

## 2020-10-28 MED ORDER — AZITHROMYCIN 250 MG PO TABS: 250.0000 mg | ORAL_TABLET | Freq: Every day | ORAL | 0 refills | Status: DC

## 2020-10-28 NOTE — Telephone Encounter (Signed)
Patient called back and is now having dark sputum when coughing. She is now on day 7 of illness. Will treat for URI.

## 2020-11-16 ENCOUNTER — Ambulatory Visit (HOSPITAL_COMMUNITY): Admission: RE | Admit: 2020-11-16 | Payer: Self-pay | Source: Ambulatory Visit

## 2020-12-02 DIAGNOSIS — F411 Generalized anxiety disorder: Secondary | ICD-10-CM | POA: Diagnosis not present

## 2020-12-02 DIAGNOSIS — K625 Hemorrhage of anus and rectum: Secondary | ICD-10-CM | POA: Diagnosis not present

## 2020-12-02 DIAGNOSIS — Z8639 Personal history of other endocrine, nutritional and metabolic disease: Secondary | ICD-10-CM | POA: Diagnosis not present

## 2020-12-02 DIAGNOSIS — L282 Other prurigo: Secondary | ICD-10-CM | POA: Diagnosis not present

## 2020-12-07 ENCOUNTER — Encounter (HOSPITAL_COMMUNITY): Payer: Self-pay | Admitting: Emergency Medicine

## 2020-12-07 ENCOUNTER — Other Ambulatory Visit (HOSPITAL_COMMUNITY): Payer: Self-pay | Admitting: Adult Health Nurse Practitioner

## 2020-12-07 ENCOUNTER — Other Ambulatory Visit: Payer: Self-pay | Admitting: Adult Health Nurse Practitioner

## 2020-12-07 ENCOUNTER — Emergency Department (HOSPITAL_COMMUNITY)
Admission: EM | Admit: 2020-12-07 | Discharge: 2020-12-07 | Disposition: A | Discharge status: Home / Self Care | Payer: BC Managed Care – PPO | Attending: Emergency Medicine | Admitting: Emergency Medicine

## 2020-12-07 ENCOUNTER — Other Ambulatory Visit: Payer: Self-pay

## 2020-12-07 ENCOUNTER — Emergency Department (HOSPITAL_COMMUNITY): Payer: BC Managed Care – PPO

## 2020-12-07 DIAGNOSIS — R109 Unspecified abdominal pain: Secondary | ICD-10-CM | POA: Diagnosis not present

## 2020-12-07 DIAGNOSIS — Z87891 Personal history of nicotine dependence: Secondary | ICD-10-CM | POA: Insufficient documentation

## 2020-12-07 DIAGNOSIS — R319 Hematuria, unspecified: Secondary | ICD-10-CM | POA: Diagnosis not present

## 2020-12-07 DIAGNOSIS — K529 Noninfective gastroenteritis and colitis, unspecified: Secondary | ICD-10-CM | POA: Insufficient documentation

## 2020-12-07 DIAGNOSIS — N2889 Other specified disorders of kidney and ureter: Secondary | ICD-10-CM | POA: Insufficient documentation

## 2020-12-07 DIAGNOSIS — N289 Disorder of kidney and ureter, unspecified: Secondary | ICD-10-CM

## 2020-12-07 DIAGNOSIS — K769 Liver disease, unspecified: Secondary | ICD-10-CM

## 2020-12-07 LAB — COMPREHENSIVE METABOLIC PANEL
ALT: 14 U/L (ref 0–44)
AST: 17 U/L (ref 15–41)
Albumin: 3.9 g/dL (ref 3.5–5.0)
Alkaline Phosphatase: 51 U/L (ref 38–126)
Anion gap: 6 (ref 5–15)
BUN: 18 mg/dL (ref 6–20)
CO2: 24 mmol/L (ref 22–32)
Calcium: 8.6 mg/dL — ABNORMAL LOW (ref 8.9–10.3)
Chloride: 105 mmol/L (ref 98–111)
Creatinine, Ser: 0.79 mg/dL (ref 0.44–1.00)
GFR, Estimated: >60 mL/min (ref >60)
Glucose, Bld: 98 mg/dL (ref 70–99)
Potassium: 3.7 mmol/L (ref 3.5–5.1)
Sodium: 135 mmol/L (ref 135–145)
Total Bilirubin: 0.6 mg/dL (ref 0.3–1.2)
Total Protein: 7.5 g/dL (ref 6.5–8.1)

## 2020-12-07 LAB — URINALYSIS, ROUTINE W REFLEX MICROSCOPIC
Bilirubin Urine: NEGATIVE
Glucose, UA: NEGATIVE mg/dL
Ketones, ur: NEGATIVE mg/dL
Leukocytes,Ua: NEGATIVE
Nitrite: NEGATIVE
Protein, ur: NEGATIVE mg/dL
Specific Gravity, Urine: 1.025 (ref 1.005–1.030)
pH: 5 (ref 5.0–8.0)

## 2020-12-07 LAB — CBC WITH DIFFERENTIAL/PLATELET
Abs Immature Granulocytes: 0.04 10*3/uL (ref 0.00–0.07)
Basophils Absolute: 0 10*3/uL (ref 0.0–0.1)
Basophils Relative: 0 %
Eosinophils Absolute: 0.2 10*3/uL (ref 0.0–0.5)
Eosinophils Relative: 2 %
HCT: 42.2 % (ref 36.0–46.0)
Hemoglobin: 13.9 g/dL (ref 12.0–15.0)
Immature Granulocytes: 0 %
Lymphocytes Relative: 9 %
Lymphs Abs: 1 10*3/uL (ref 0.7–4.0)
MCH: 29.2 pg (ref 26.0–34.0)
MCHC: 32.9 g/dL (ref 30.0–36.0)
MCV: 88.7 fL (ref 80.0–100.0)
Monocytes Absolute: 0.5 10*3/uL (ref 0.1–1.0)
Monocytes Relative: 5 %
Neutro Abs: 9 10*3/uL — ABNORMAL HIGH (ref 1.7–7.7)
Neutrophils Relative %: 84 %
Platelets: 282 10*3/uL (ref 150–400)
RBC: 4.76 MIL/uL (ref 3.87–5.11)
RDW: 13.1 % (ref 11.5–15.5)
WBC: 10.7 10*3/uL — ABNORMAL HIGH (ref 4.0–10.5)
nRBC: 0 % (ref 0.0–0.2)

## 2020-12-07 LAB — LIPASE, BLOOD: Lipase: 35 U/L (ref 11–51)

## 2020-12-07 IMAGING — CT CT ABD-PELV W/ CM
2 of 5 series · 14 of 46 positions shown, 16 images · IV contrast (Omnipaque or Isovue)
Comparison: None.

CLINICAL DATA: Mid abdominal pain radiating into the back and
hematuria.

EXAM:
CT ABDOMEN AND PELVIS WITH CONTRAST
TECHNIQUE: Multidetector CT imaging of the abdomen and pelvis was performed
using the standard protocol following bolus administration of
intravenous contrast.
CONTRAST:  100mL OMNIPAQUE IOHEXOL 300 MG/ML  SOLN

[Series 2: axial st · axial · 0.68mm/px · z∈[+790,+1260]mm · 11 of 106 slices shown, 13 images]
[im 6/106  soft-tissue]
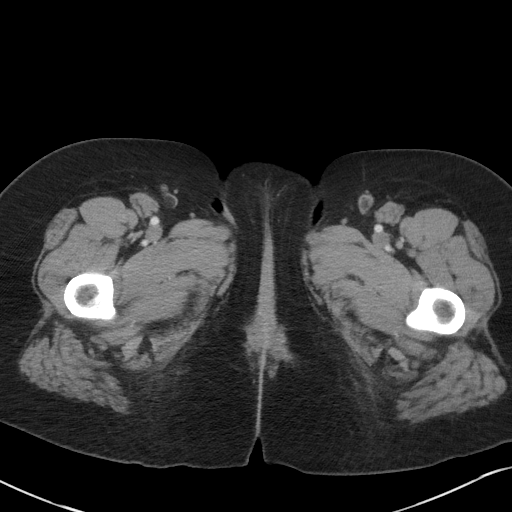
[im 6/106  bone]
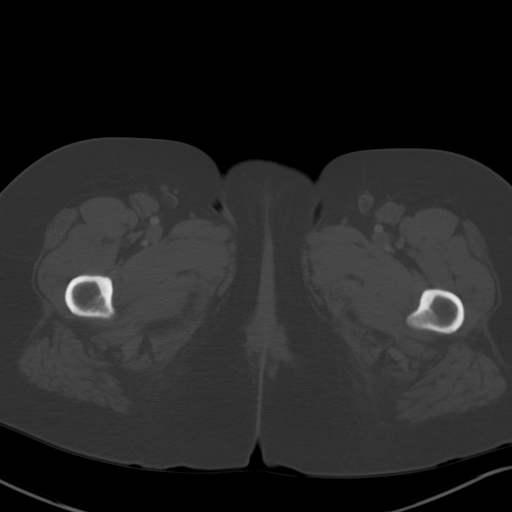
[im 18/106  soft-tissue]
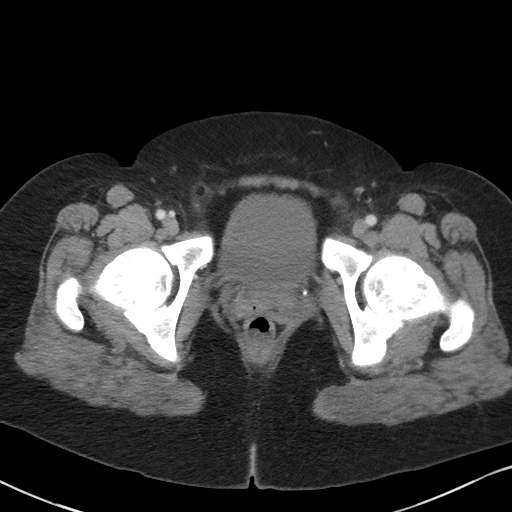
[im 24/106  soft-tissue]
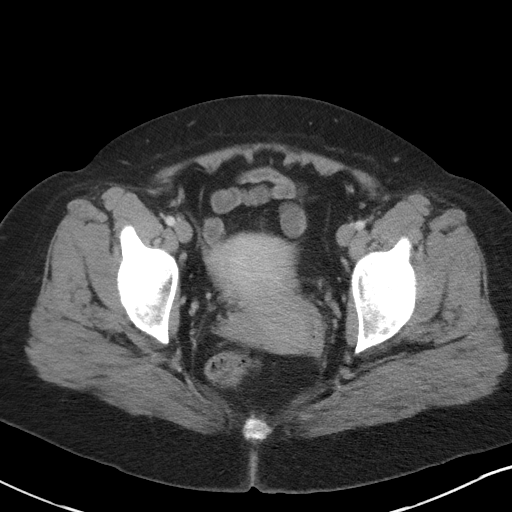
[im 36/106  soft-tissue]
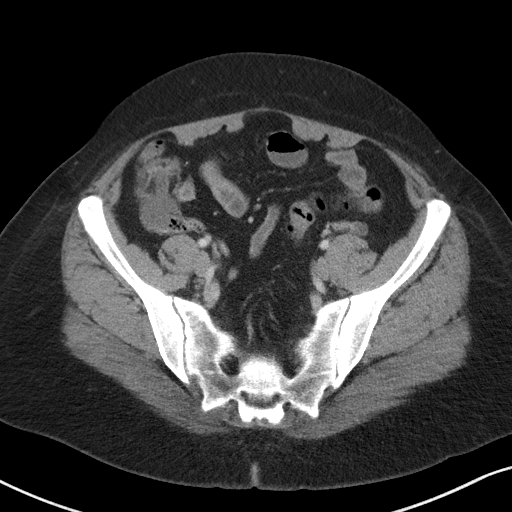
[im 41/106  soft-tissue]
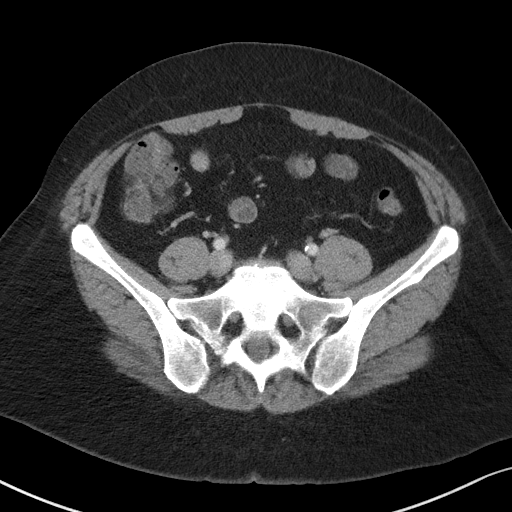
[im 53/106  soft-tissue]
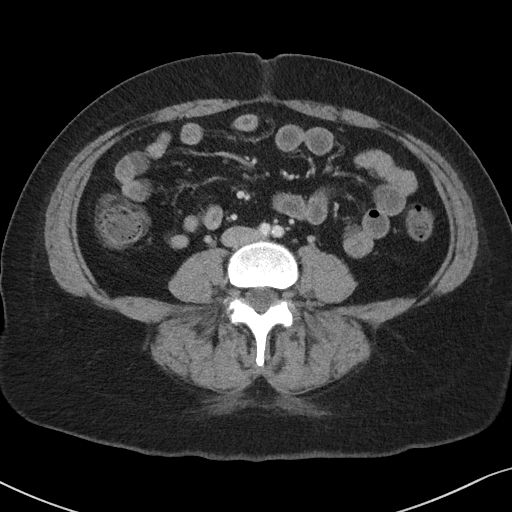
[im 65/106  soft-tissue]
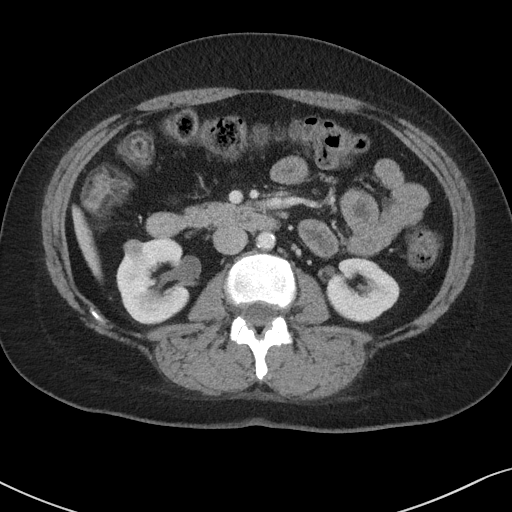
[im 71/106  soft-tissue]
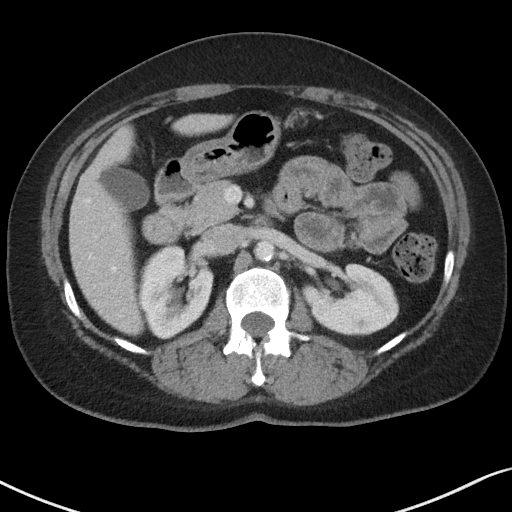
[im 82/106  soft-tissue]
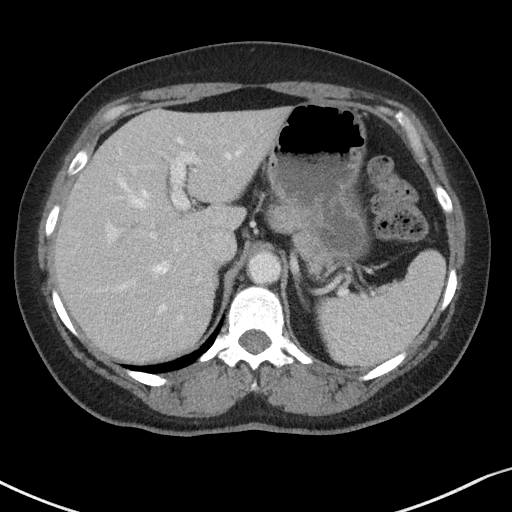
[im 82/106  bone]
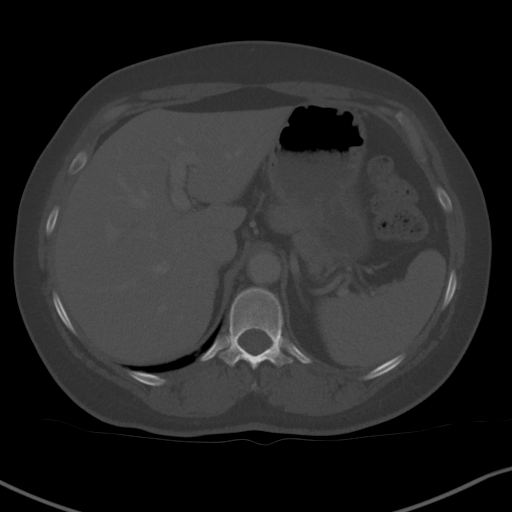
[im 88/106  soft-tissue]
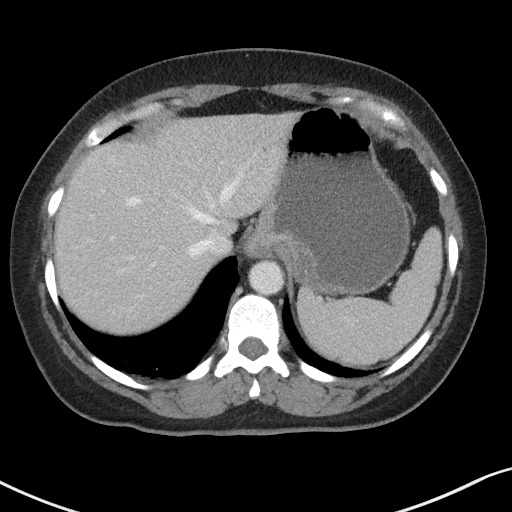
[im 100/106  soft-tissue]
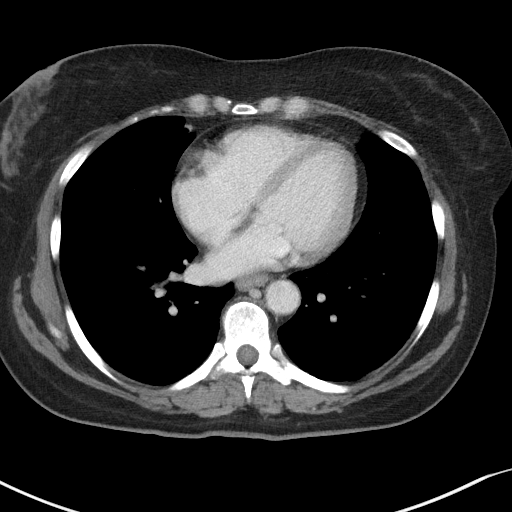

[Series 6: coronal st · coronal · 0.67mm/px · 3 of 85 slices shown]
[im 29/85  soft-tissue]
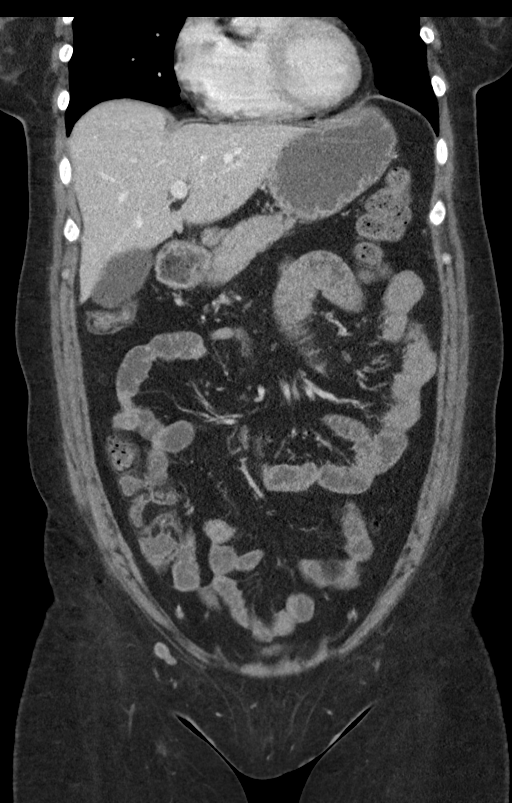
[im 38/85  soft-tissue]
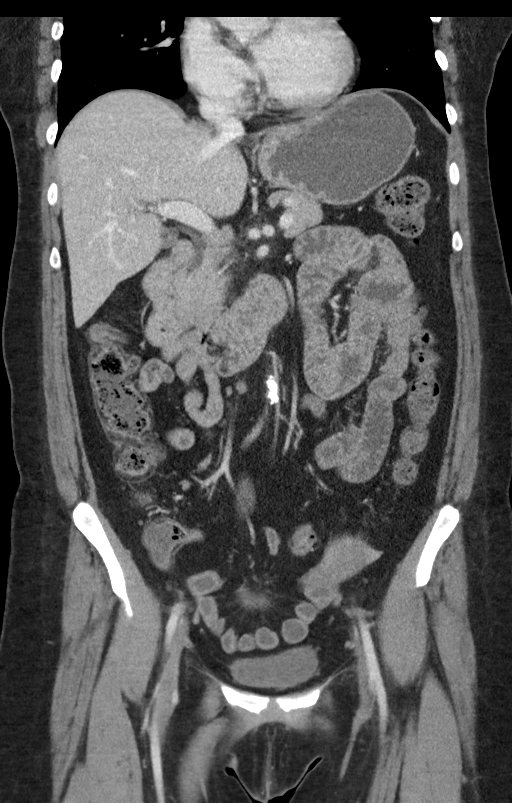
[im 47/85  soft-tissue]
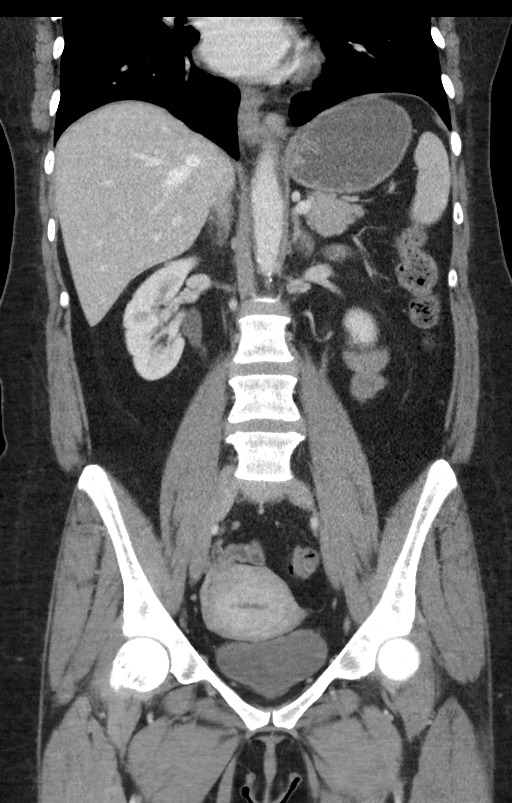

[14 of 46 positions shown; findings below may reference images not displayed]

FINDINGS: Lower chest: No acute abnormality.

Hepatobiliary: 4 x 9 mm low-density area in the left lobe of the
liver is likely a cyst but is too small to accurately characterize.
No gallstones, gallbladder wall thickening, or biliary dilatation.

Pancreas: Unremarkable. No pancreatic ductal dilatation or
surrounding inflammatory changes.

Spleen: Normal in size without focal abnormality.

Adrenals/Urinary Tract: No adrenal masses. Hyperdense/enhancing
cortical renal lesion of the mid anterior right kidney measures
approximately 1.3 x 1.2 x 1.2 cm. Small solid neoplasm is not
excluded. Low-density exophytic lesion of the upper pole of the left
kidney measuring approximately 1.6 cm in greatest diameter likely
represents a benign cyst. No evidence of renal calculi or
hydronephrosis bilaterally. No ureteral or bladder calculi
identified. The bladder is mildly distended and shows no overt
thickening or inflammation.

Stomach/Bowel: Mild nonspecific fluid in nondilated small bowel and
suggestion some potential mild small bowel hyperemia. This may be
consistent with a component enteritis. The appendix is normal. No
free air.

Vascular/Lymphatic: Atherosclerosis of the abdominal aorta and iliac
arteries without evidence of aneurysm. No enlarged abdominal or
pelvic lymph nodes.

Reproductive: Uterus and bilateral adnexa are unremarkable.

Other: No abdominal wall hernia or abnormality. No focal abscess or
ascites.

Musculoskeletal: No acute or significant osseous findings.
IMPRESSION: 1. Hyperdense/enhancing cortical lesion of the mid anterior right
kidney measures approximately 1.3 x 1.2 x 1.2 cm. Small solid
neoplasm is not excluded. Further evaluation with elective MRI of
the abdomen with and without contrast is recommended.
2. Low-density exophytic lesion of the upper pole of the left kidney
likely represents a benign cyst.
3. Mild nonspecific fluid in nondilated small bowel and suggestion
some potential mild small bowel hyperemia. This may be consistent
with a component of enteritis.
4. Small low-density lesion in the left lobe of the liver is felt to
most likely represent a cyst. MRI evaluation of the right renal
lesion will also include evaluation of this liver lesion.
5. Aortic atherosclerosis without evidence of aneurysm.

Aortic Atherosclerosis ([PL]-[PL]).

## 2020-12-07 MED ORDER — IOHEXOL 300 MG/ML  SOLN
100.0000 mL | Freq: Once | INTRAMUSCULAR | Status: AC | PRN
  Administered 2020-12-07: 100 mL via INTRAVENOUS

## 2020-12-07 MED ORDER — HYDROCODONE-ACETAMINOPHEN 5-325 MG PO TABS: 1.0000 | ORAL_TABLET | Freq: Four times a day (QID) | ORAL | 0 refills | Status: DC | PRN

## 2020-12-07 MED ORDER — FENTANYL CITRATE (PF) 100 MCG/2ML IJ SOLN
50.0000 ug | Freq: Once | INTRAMUSCULAR | Status: AC
  Administered 2020-12-07: 50 ug via INTRAVENOUS
  Filled 2020-12-07: qty 2

## 2020-12-07 MED ORDER — ONDANSETRON HCL 4 MG/2ML IJ SOLN
4.0000 mg | Freq: Once | INTRAMUSCULAR | Status: AC
  Administered 2020-12-07: 4 mg via INTRAVENOUS
  Filled 2020-12-07: qty 2

## 2020-12-07 NOTE — Discharge Instructions (Signed)
Follow-up with your PCP or Urology for the blood in the urine and the renal mass.

## 2020-12-07 NOTE — ED Provider Notes (Signed)
[facility] EMERGENCY DEPARTMENT Provider Note   CSN: [id number] Arrival date & time: 12/07/20  0546     History Chief Complaint  Patient presents with  . Abdominal Pain    Christina Brown is a 50 y.o. female.  HPI Patient presents with abdominal pain.  States it is over most of her abdomen.  States that it feels as if it could be larger.  Began last night.  No dysuria.  Does have some pain that goes to the back.  No nausea or vomiting.  For me denied hematuria but reportedly told the nurse she had some blood in the urine.  Has not had pains like this before.  No diarrhea or constipation.  No fevers.  States she cannot find a comfortable position.    Past Medical History:  Diagnosis Date  . Angioedema 02/24/2020  . Anxiety   . Depression   . Hyperthyroidism    controlled with meds  . Sebaceous cyst 09/2018   Right posterior neck  . Urticaria     Patient Active Problem List   Diagnosis Date Noted  . Recurrent urticaria 02/24/2020  . Allergic reaction 02/24/2020  . Angioedema 02/24/2020    Past Surgical History:  Procedure Laterality Date  . BREAST LUMPECTOMY Right 07/16/2019   Procedure: RIGHT BREAST LUMPECTOMY;  Surgeon: Donnie Mesa, MD;  Location: [place];  Service: General;  Laterality: Right;  . BREAST LUMPECTOMY    . MASS EXCISION Right 10/11/2018   Procedure: EXCISION OF SEBACEOUS CYST - RIGHT POSTERIOR NECK;  Surgeon: Donnie Mesa, MD;  Location: [place];  Service: General;  Laterality: Right;  . MOUTH SURGERY      2019 in dental office  . MOUTH SURGERY       OB History   No obstetric history on file.     Family History  Problem Relation Age of Onset  . Diabetes Mother   . Heart disease Mother   . Kidney disease Mother   . Melanoma Father   . Diabetes Sister   . Heart disease Sister   . Lung disease Sister   . Angioedema Neg Hx   . Allergic rhinitis Neg Hx   . Asthma Neg Hx   . Atopy Neg Hx   . Eczema  Neg Hx   . Immunodeficiency Neg Hx   . Urticaria Neg Hx     Social History   Tobacco Use  . Smoking status: Former Smoker    Quit date: 05/2017    Years since quitting: 3.5  . Smokeless tobacco: Never Used  Vaping Use  . Vaping Use: Never used  Substance Use Topics  . Alcohol use: Not Currently  . Drug use: Never    Home Medications Prior to Admission medications   Medication Sig Start Date End Date Taking? Authorizing Provider  HYDROcodone-acetaminophen (NORCO/VICODIN) 5-325 MG tablet Take 1-2 tablets by mouth every 6 (six) hours as needed. 12/07/20  Yes Davonna Belling, MD  azithromycin (ZITHROMAX) 250 MG tablet Take 1 tablet (250 mg total) by mouth daily. Take first 2 tablets together, then 1 every day until finished. 10/28/20   Faustino Congress, NP  cetirizine (ZYRTEC) 10 MG tablet Take 10 mg by mouth 2 (two) times daily.    [provider]  escitalopram (LEXAPRO) 5 MG tablet Take 5 mg by mouth daily.    [provider]  famotidine (PEPCID) 20 MG tablet Take 1 tablet (20 mg total) by mouth 2 (two) times daily as needed  for heartburn or indigestion. 02/24/20   Bobbitt, Sedalia Muta, MD  levocetirizine (XYZAL) 5 MG tablet Take 1 tablet (5 mg total) by mouth daily as needed for allergies. 02/24/20   Bobbitt, [place] Muta, MD  omeprazole (PRILOSEC) 20 MG capsule Take 1 capsule (20 mg total) by mouth in the morning and at bedtime for 14 days. 03/03/20 03/17/20  Bobbitt, Sedalia Muta, MD  atenolol (TENORMIN) 25 MG tablet Take by mouth daily.  10/12/19  [provider]    Allergies    Ibuprofen  Review of Systems   Review of Systems  Constitutional: Negative for appetite change, fatigue and fever.  HENT: Negative for congestion.   Respiratory: Negative for shortness of breath.   Gastrointestinal: Positive for abdominal pain.  Genitourinary: Negative for dysuria.  Musculoskeletal: Positive for back pain.  Skin: Negative for rash.  Neurological:  Negative for tremors and headaches.  Psychiatric/Behavioral: Negative for confusion.    Physical Exam Updated Vital Signs BP 114/77   Pulse 79   Temp 98.2 F (36.8 C) (Oral)   Resp 18   Ht 5\' 6"  (1.676 m)   Wt 88.5 kg   LMP 12/04/2020   SpO2 92%   BMI 31.47 kg/m   Physical Exam Vitals and nursing note reviewed.  Constitutional:      Comments: Patient is tearful.  HENT:     Head: Normocephalic.  Cardiovascular:     Rate and Rhythm: Normal rate and regular rhythm.  Pulmonary:     Breath sounds: Normal breath sounds.  Abdominal:     Comments: Mild diffuse tenderness but may be worse on the right side.  No rebound or guarding.  No hernias palpated.  Genitourinary:    Comments: No CVA tenderness. Skin:    General: Skin is warm.     Capillary Refill: Capillary refill takes less than 2 seconds.  Neurological:     Mental Status: She is alert and oriented to person, place, and time.     ED Results / Procedures / Treatments   Labs (all labs ordered are listed, but only abnormal results are displayed) Labs Reviewed  URINALYSIS, ROUTINE W REFLEX MICROSCOPIC - Abnormal; Notable for the following components:      Result Value   APPearance HAZY (*)    Hgb urine dipstick LARGE (*)    Bacteria, UA RARE (*)    All other components within normal limits  CBC WITH DIFFERENTIAL/PLATELET - Abnormal; Notable for the following components:   WBC 10.7 (*)    Neutro Abs 9.0 (*)    All other components within normal limits  COMPREHENSIVE METABOLIC PANEL - Abnormal; Notable for the following components:   Calcium 8.6 (*)    All other components within normal limits  LIPASE, BLOOD    EKG None  Radiology CT ABDOMEN PELVIS W CONTRAST  Result Date: 12/07/2020 CLINICAL DATA:  Mid abdominal pain radiating into the back and hematuria. EXAM: CT ABDOMEN AND PELVIS WITH CONTRAST TECHNIQUE: Multidetector CT imaging of the abdomen and pelvis was performed using the standard protocol following  bolus administration of intravenous contrast. CONTRAST:  165mL OMNIPAQUE IOHEXOL 300 MG/ML  SOLN COMPARISON:  None. FINDINGS: Lower chest: No acute abnormality. Hepatobiliary: 4 x 9 mm low-density area in the left lobe of the liver is likely a cyst but is too small to accurately characterize. No gallstones, gallbladder wall thickening, or biliary dilatation. Pancreas: Unremarkable. No pancreatic ductal dilatation or surrounding inflammatory changes. Spleen: Normal in size without focal abnormality. Adrenals/Urinary Tract: No  adrenal masses. Hyperdense/enhancing cortical renal lesion of the mid anterior right kidney measures approximately 1.3 x 1.2 x 1.2 cm. Small solid neoplasm is not excluded. Low-density exophytic lesion of the upper pole of the left kidney measuring approximately 1.6 cm in greatest diameter likely represents a benign cyst. No evidence of renal calculi or hydronephrosis bilaterally. No ureteral or bladder calculi identified. The bladder is mildly distended and shows no overt thickening or inflammation. Stomach/Bowel: Mild nonspecific fluid in nondilated small bowel and suggestion some potential mild small bowel hyperemia. This may be consistent with a component enteritis. The appendix is normal. No free air. Vascular/Lymphatic: Atherosclerosis of the abdominal aorta and iliac arteries without evidence of aneurysm. No enlarged abdominal or pelvic lymph nodes. Reproductive: Uterus and bilateral adnexa are unremarkable. Other: No abdominal wall hernia or abnormality. No focal abscess or ascites. Musculoskeletal: No acute or significant osseous findings. IMPRESSION: 1. Hyperdense/enhancing cortical lesion of the mid anterior right kidney measures approximately 1.3 x 1.2 x 1.2 cm. Small solid neoplasm is not excluded. Further evaluation with elective MRI of the abdomen with and without contrast is recommended. 2. Low-density exophytic lesion of the upper pole of the left kidney likely represents a  benign cyst. 3. Mild nonspecific fluid in nondilated small bowel and suggestion some potential mild small bowel hyperemia. This may be consistent with a component of enteritis. 4. Small low-density lesion in the left lobe of the liver is felt to most likely represent a cyst. MRI evaluation of the right renal lesion will also include evaluation of this liver lesion. 5. Aortic atherosclerosis without evidence of aneurysm. Aortic Atherosclerosis (ICD10-I70.0). Electronically Signed   By: Aletta Edouard M.D.   On: 12/07/2020 09:09    Procedures Procedures   Medications Ordered in ED Medications  fentaNYL (SUBLIMAZE) injection 50 mcg (50 mcg Intravenous Given 12/07/20 0727)  ondansetron (ZOFRAN) injection 4 mg (4 mg Intravenous Given 12/07/20 0727)  iohexol (OMNIPAQUE) 300 MG/ML solution 100 mL (100 mLs Intravenous Contrast Given 12/07/20 0818)    ED Course  I have reviewed the triage vital signs and the nursing notes.  Pertinent labs & imaging results that were available during my care of the patient were reviewed by me and considered in my medical decision making (see chart for details).    MDM Rules/Calculators/A&P                         Patient with flank pain/abdominal pain.  Some hematuria.  Does show hematuria in urine.  Lab work otherwise reassuring.  White count mildly elevated.  CT scan done and showed possible enteritis.  However also showed renal mass on the right side.  Potentially could be the cause of the hematuria.  No UTI.  Will have patient follow-up with PCP and/or urology.  Discharge home with pain medicine. Final Clinical Impression(s) / ED Diagnoses Final diagnoses:  Flank pain  Renal mass  Hematuria, unspecified type  Enteritis    Rx / DC Orders ED Discharge Orders         Ordered    HYDROcodone-acetaminophen (NORCO/VICODIN) 5-325 MG tablet  Every 6 hours PRN        12/07/20 1027           Davonna Belling, MD 12/07/20 1030

## 2020-12-07 NOTE — ED Triage Notes (Signed)
Pt with abdominal pain that "feels like burning", back pain, and blood in urine.

## 2020-12-08 ENCOUNTER — Ambulatory Visit (HOSPITAL_COMMUNITY)
Admission: RE | Admit: 2020-12-08 | Discharge: 2020-12-08 | Disposition: A | Discharge status: Home / Self Care | Payer: BC Managed Care – PPO | Source: Ambulatory Visit | Attending: Adult Health Nurse Practitioner | Admitting: Adult Health Nurse Practitioner

## 2020-12-08 DIAGNOSIS — K449 Diaphragmatic hernia without obstruction or gangrene: Secondary | ICD-10-CM | POA: Diagnosis not present

## 2020-12-08 DIAGNOSIS — K769 Liver disease, unspecified: Secondary | ICD-10-CM | POA: Diagnosis not present

## 2020-12-08 DIAGNOSIS — N281 Cyst of kidney, acquired: Secondary | ICD-10-CM | POA: Diagnosis not present

## 2020-12-08 DIAGNOSIS — N289 Disorder of kidney and ureter, unspecified: Secondary | ICD-10-CM | POA: Diagnosis not present

## 2020-12-08 DIAGNOSIS — K7689 Other specified diseases of liver: Secondary | ICD-10-CM | POA: Diagnosis not present

## 2020-12-08 IMAGING — MR MR ABDOMEN WO/W CM
21 series · 48 of 48 positions shown · IV contrast (gadavist)
Comparison: CT on [DATE]

CLINICAL DATA: Abdominal pain for 1 week. Indeterminate right renal
and hepatic lesions on recent CT.

EXAM:
MRI ABDOMEN WITHOUT AND WITH CONTRAST
TECHNIQUE: Multiplanar multisequence MR imaging of the abdomen was performed
both before and after the administration of intravenous contrast.
CONTRAST:  7mL GADAVIST GADOBUTROL 1 MMOL/ML IV SOLN

[Series 3: cor haste · coronal · 6.0mm · 1.19mm/px · 2 of 30 slices shown]
[im 1/30]
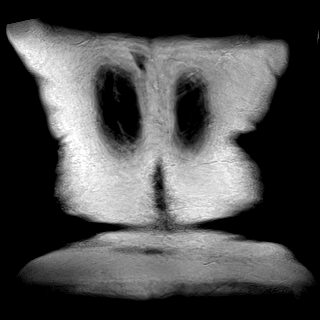
[im 30/30]
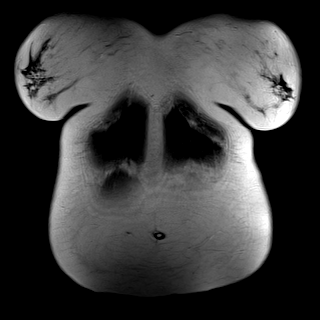

[Series 4: ax haste · axial · 6.0mm · 1.19mm/px · 1 of 30 slices shown]
[im 1/30]
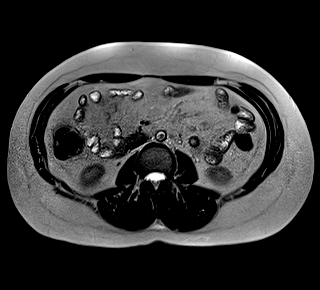

[Series 5: T2 fat-sat · axial · 6.0mm · 1.19mm/px · 1 of 30 slices shown]
[im 1/30]
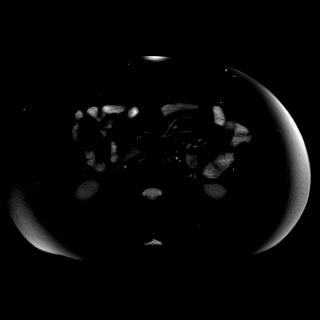

[Series 6: ax in and · axial · 3.5mm · 1.31mm/px · z∈[-59,+190]mm · 3 of 72 slices shown (1 of 2)]
[im 1/72]
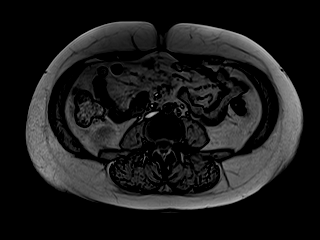
[im 36/72]
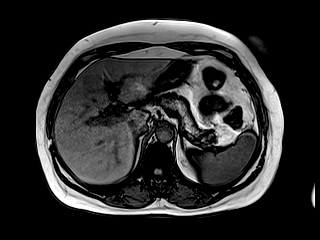
[im 72/72]
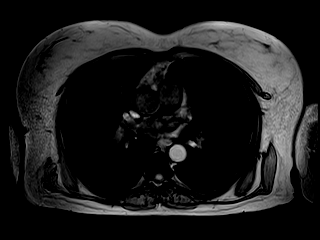

[Series 7: ax in and · axial · 3.5mm · 1.31mm/px · z∈[-59,+190]mm · 3 of 72 slices shown (2 of 2)]
[im 1/72]
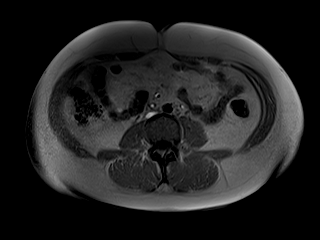
[im 36/72]
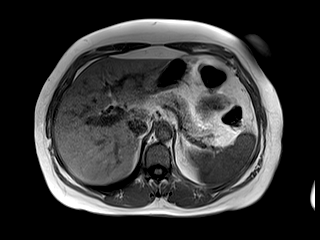
[im 72/72]
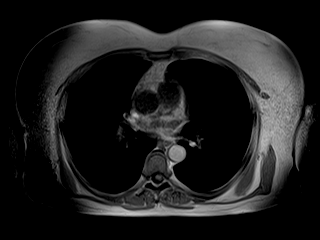

[Series 8: DWI · axial · 6.0mm · 1.42mm/px · 1 of 30 slices shown (1 of 4)]
[im 1/30]
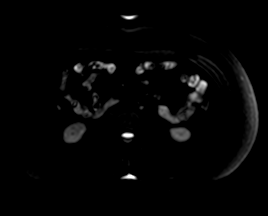

[Series 8: DWI · axial · 6.0mm · 1.42mm/px · 1 of 30 slices shown (2 of 4)]
[im 1/30]
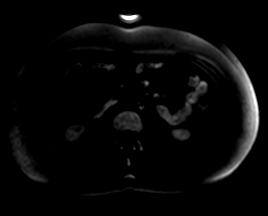

[Series 8: DWI · axial · 6.0mm · 1.42mm/px · 1 of 30 slices shown (3 of 4)]
[im 1/30]
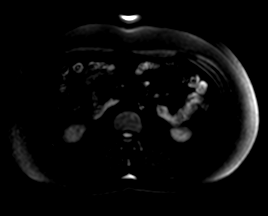

[Series 9: DWI · axial · 6.0mm · 1.42mm/px · 1 of 30 slices shown (4 of 4)]
[im 1/30]
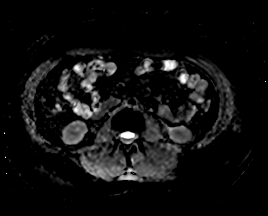

[Series 10: bSSFP · axial · 6.0mm · 0.74mm/px · 1 of 30 slices shown]
[im 1/30]
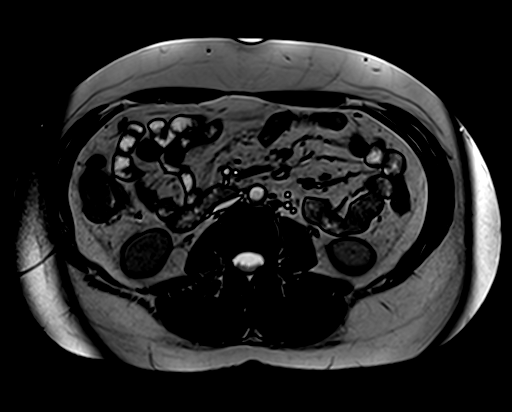

[Series 11: t1_vibe_fs_tra_p4_bh_pre · axial · 3.0mm · 1.34mm/px · z∈[-27,+186]mm · 3 of 72 slices shown]
[im 1/72]
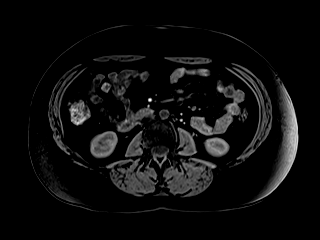
[im 36/72]
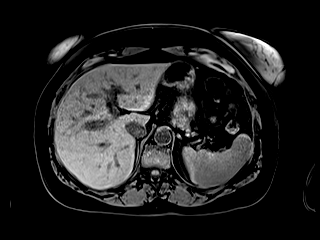
[im 72/72]
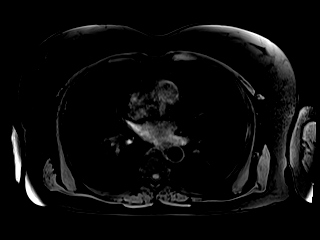

[Series 13: t1_vibe_fs_tra_p4_bh_post · axial · 3.0mm · 1.34mm/px · z∈[-27,+186]mm · 3 of 72 slices shown (1 of 4)]
[im 1/72]
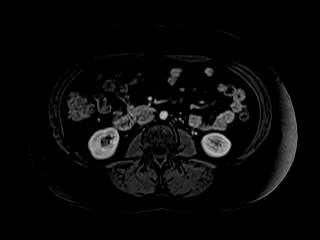
[im 36/72]
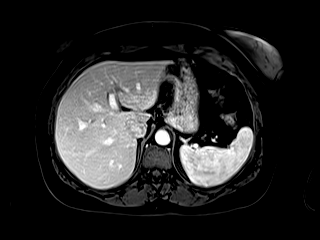
[im 72/72]
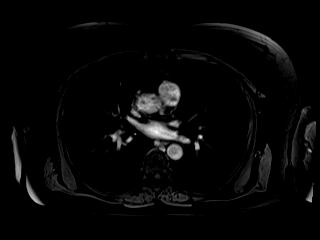

[Series 14: t1_vibe_fs_tra_p4_bh_post_sub · axial · 3.0mm · 1.34mm/px · z∈[-27,+186]mm · 3 of 72 slices shown (1 of 4)]
[im 1/72]
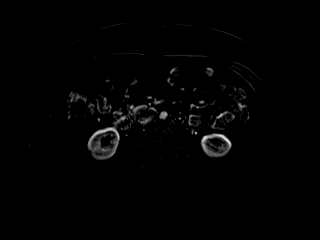
[im 36/72]
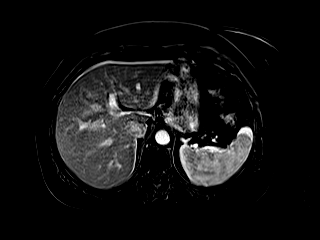
[im 72/72]
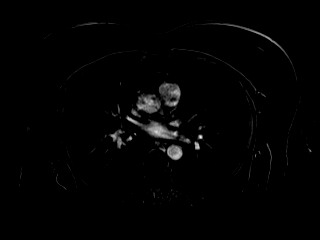

[Series 15: t1_vibe_fs_tra_p4_bh_post · axial · 3.0mm · 1.34mm/px · z∈[-27,+186]mm · 3 of 72 slices shown (2 of 4)]
[im 1/72]
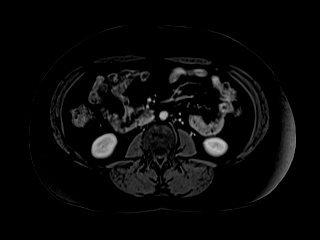
[im 36/72]
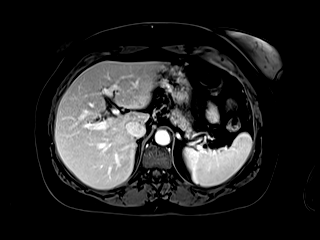
[im 72/72]
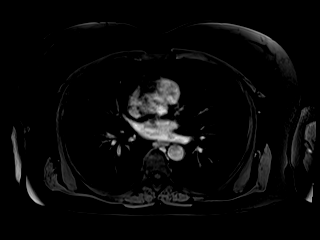

[Series 16: t1_vibe_fs_tra_p4_bh_post_sub · axial · 3.0mm · 1.34mm/px · z∈[-27,+186]mm · 3 of 72 slices shown (2 of 4)]
[im 1/72]
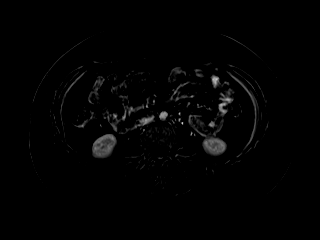
[im 36/72]
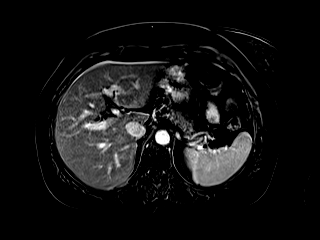
[im 72/72]
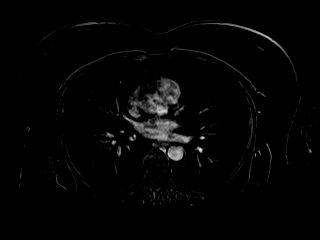

[Series 17: t1_vibe_fs_tra_p4_bh_post · axial · 3.0mm · 1.34mm/px · z∈[-27,+186]mm · 3 of 72 slices shown (3 of 4)]
[im 1/72]
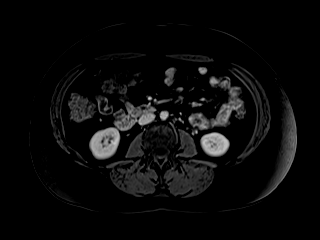
[im 36/72]
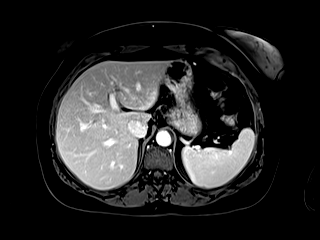
[im 72/72]
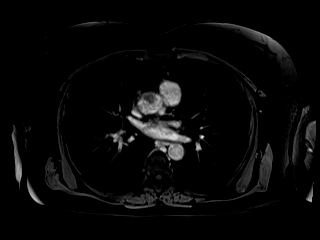

[Series 18: t1_vibe_fs_tra_p4_bh_post_sub · axial · 3.0mm · 1.34mm/px · z∈[-27,+186]mm · 3 of 72 slices shown (3 of 4)]
[im 1/72]
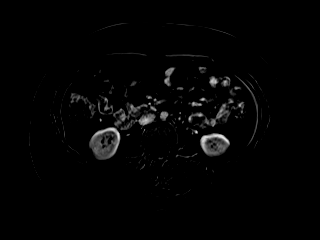
[im 36/72]
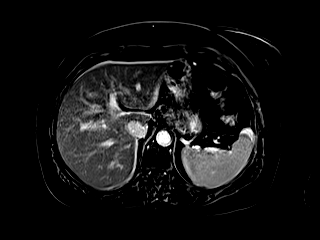
[im 72/72]
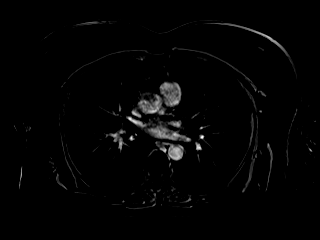

[Series 19: t1_vibe_fs_tra_p4_bh_post · axial · 3.0mm · 1.34mm/px · z∈[-27,+186]mm · 3 of 72 slices shown (4 of 4)]
[im 1/72]
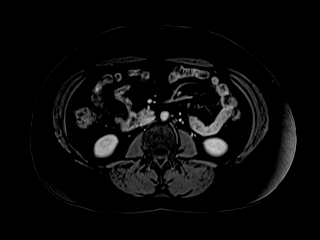
[im 36/72]
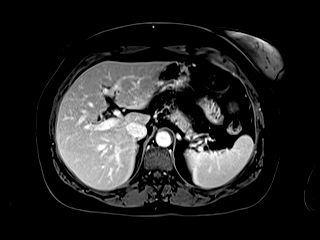
[im 72/72]
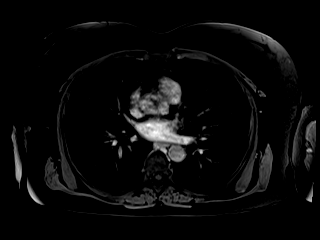

[Series 20: t1_vibe_fs_tra_p4_bh_post_sub · axial · 3.0mm · 1.34mm/px · z∈[-27,+186]mm · 3 of 72 slices shown (4 of 4)]
[im 1/72]
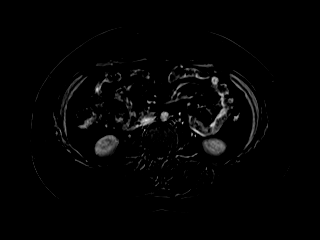
[im 36/72]
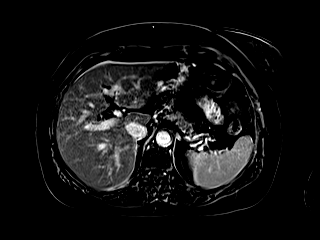
[im 72/72]
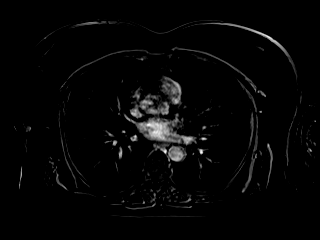

[Series 21: T1 dynamic post-contrast · coronal · 3.0mm · 1.31mm/px · 3 of 72 slices shown (1 of 2)]
[im 1/72]
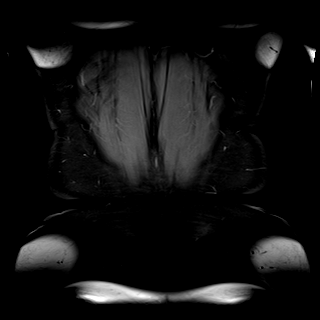
[im 36/72]
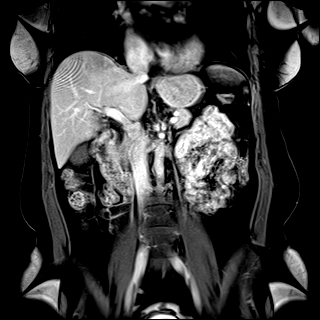
[im 72/72]
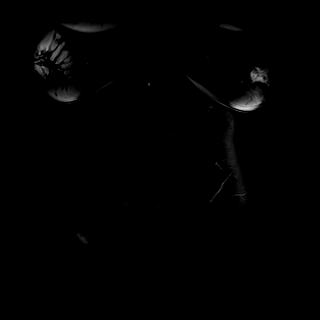

[Series 22: T1 dynamic post-contrast · axial · 3.0mm · 1.19mm/px · z∈[-27,+186]mm · 3 of 72 slices shown (2 of 2)]
[im 1/72]
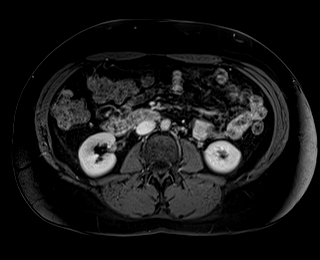
[im 36/72]
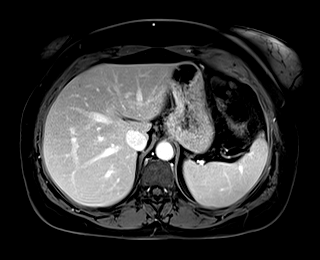
[im 72/72]
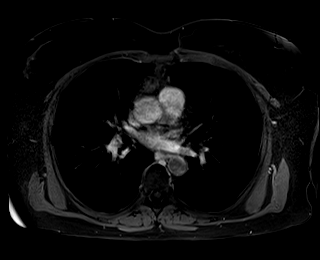

[48 of 48 positions shown; findings below may reference images not displayed]

FINDINGS: Lower chest: No acute findings.

Hepatobiliary: A few tiny sub-cm cysts are noted in the left lobe,
which correspond with the lesion seen on recent CT. No liver masses
are identified. Gallbladder is unremarkable. No evidence of biliary
ductal dilatation.

Pancreas:  No mass or inflammatory changes.

Spleen:  Within normal limits in size and appearance.

Adrenals/Urinary Tract: Normal adrenal glands. A small subcapsular
benign Bosniak category 2 cyst is seen in the medial upper pole of
the left kidney. A 9 mm subcapsular lesion is seen in the anterior
midpole of the right kidney which is difficult to characterize due
to its small size. On subtraction imaging, this shows mild rim
enhancement and possible mild internal contrast enhancement. Small
solid neoplasm cannot be excluded. No evidence of hydronephrosis.

Stomach/Bowel: Tiny hiatal hernia noted.  Otherwise unremarkable.

Vascular/Lymphatic: No pathologically enlarged lymph nodes
identified. No abdominal aortic aneurysm.

Other:  None.

Musculoskeletal:  No suspicious bone lesions identified.
IMPRESSION: 9 mm subcapsular lesion in the anterior midpole of right kidney is
difficult to characterize due to its small size. This shows possible
contrast enhancement on subtraction imaging, and small renal cell
carcinoma cannot be excluded. Recommend continued follow-up by MRI
in 6 months.

Tiny benign cysts in liver and left kidney.

## 2020-12-08 MED ORDER — GADOBUTROL 1 MMOL/ML IV SOLN
7.0000 mL | Freq: Once | INTRAVENOUS | Status: AC | PRN
  Administered 2020-12-08: 7 mL via INTRAVENOUS

## 2020-12-11 ENCOUNTER — Ambulatory Visit (HOSPITAL_COMMUNITY)
Admission: RE | Admit: 2020-12-11 | Discharge: 2020-12-11 | Disposition: A | Discharge status: Home / Self Care | Payer: BC Managed Care – PPO | Source: Ambulatory Visit | Attending: Internal Medicine | Admitting: Internal Medicine

## 2020-12-11 ENCOUNTER — Other Ambulatory Visit: Payer: Self-pay

## 2020-12-11 DIAGNOSIS — E0500 Thyrotoxicosis with diffuse goiter without thyrotoxic crisis or storm: Secondary | ICD-10-CM | POA: Diagnosis not present

## 2020-12-11 DIAGNOSIS — Z1231 Encounter for screening mammogram for malignant neoplasm of breast: Secondary | ICD-10-CM | POA: Diagnosis not present

## 2020-12-11 DIAGNOSIS — E042 Nontoxic multinodular goiter: Secondary | ICD-10-CM | POA: Diagnosis not present

## 2020-12-11 DIAGNOSIS — Z23 Encounter for immunization: Secondary | ICD-10-CM | POA: Diagnosis not present

## 2020-12-11 IMAGING — MG MM DIGITAL SCREENING BILAT W/ TOMO AND CAD
6 of 10 series · 6 of 30 positions shown · non-contrast
Comparison: Previous exam(s).

CLINICAL DATA: Screening.

EXAM:
DIGITAL SCREENING BILATERAL MAMMOGRAM WITH TOMOSYNTHESIS AND CAD
TECHNIQUE: Bilateral screening digital craniocaudal and mediolateral oblique
mammograms were obtained. Bilateral screening digital breast
tomosynthesis was performed. The images were evaluated with
computer-aided detection.

[L MLO synth-2D (1 of 2)]
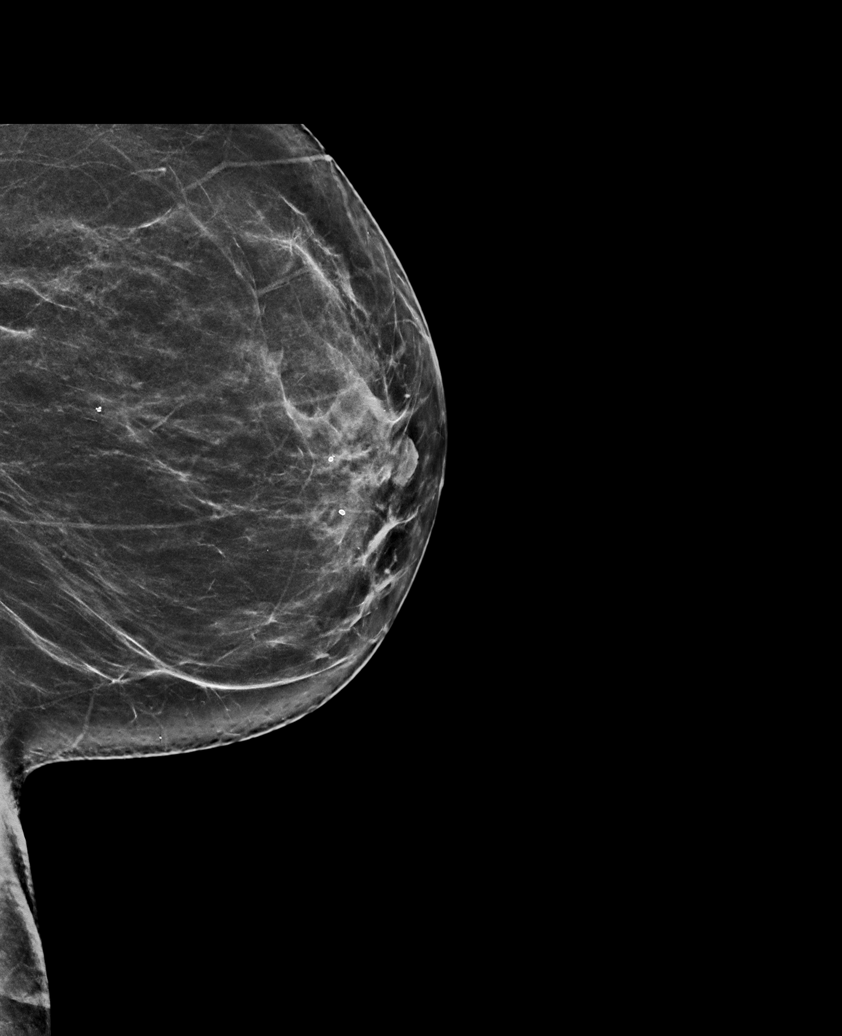

[R CC synth-2D]
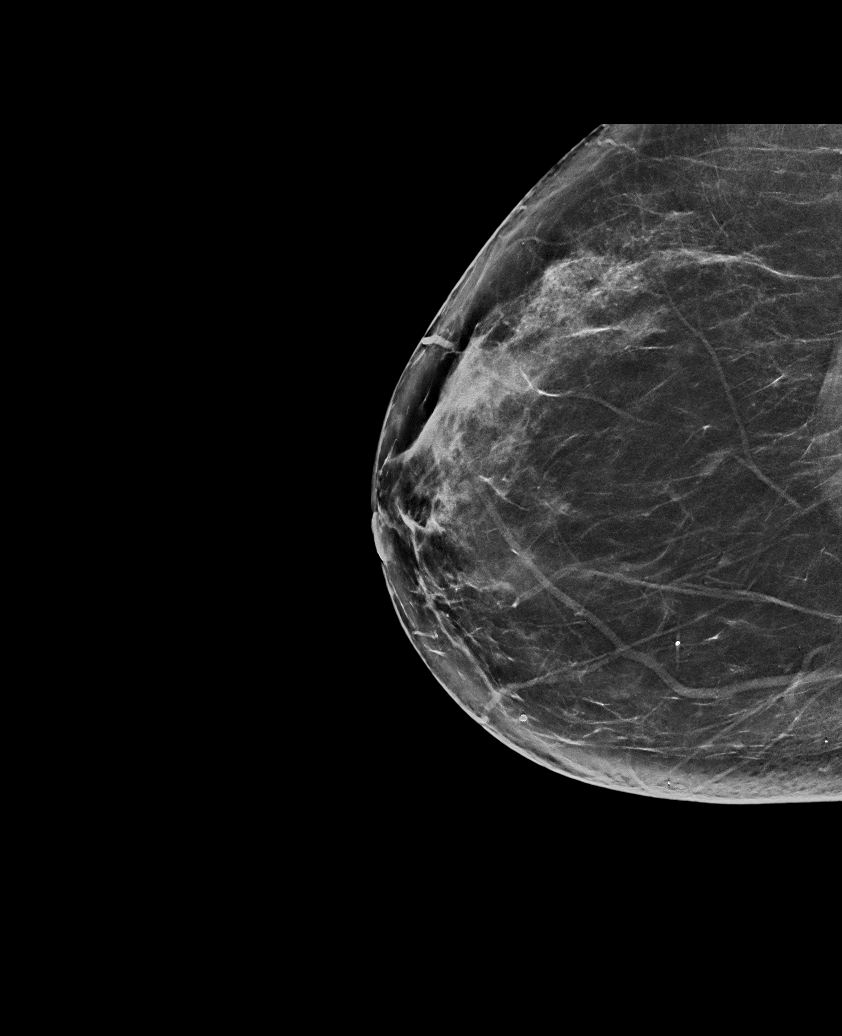

[L MLO synth-2D (2 of 2)]
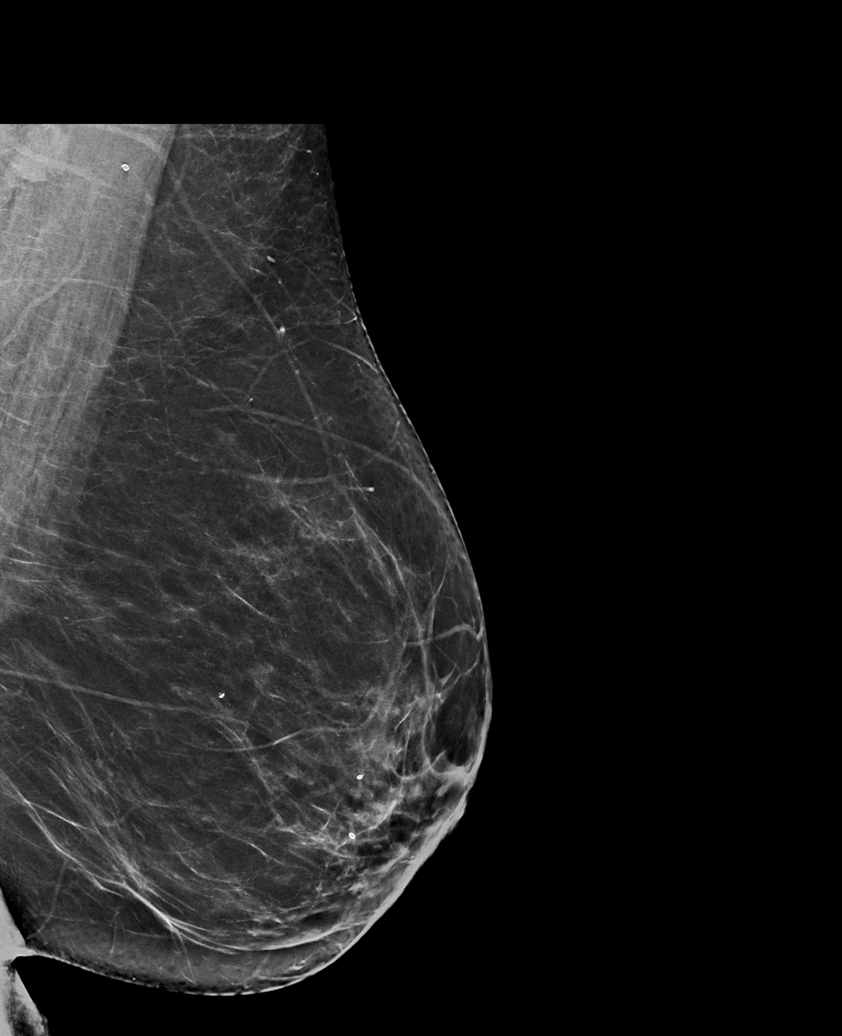

[L CC synth-2D]
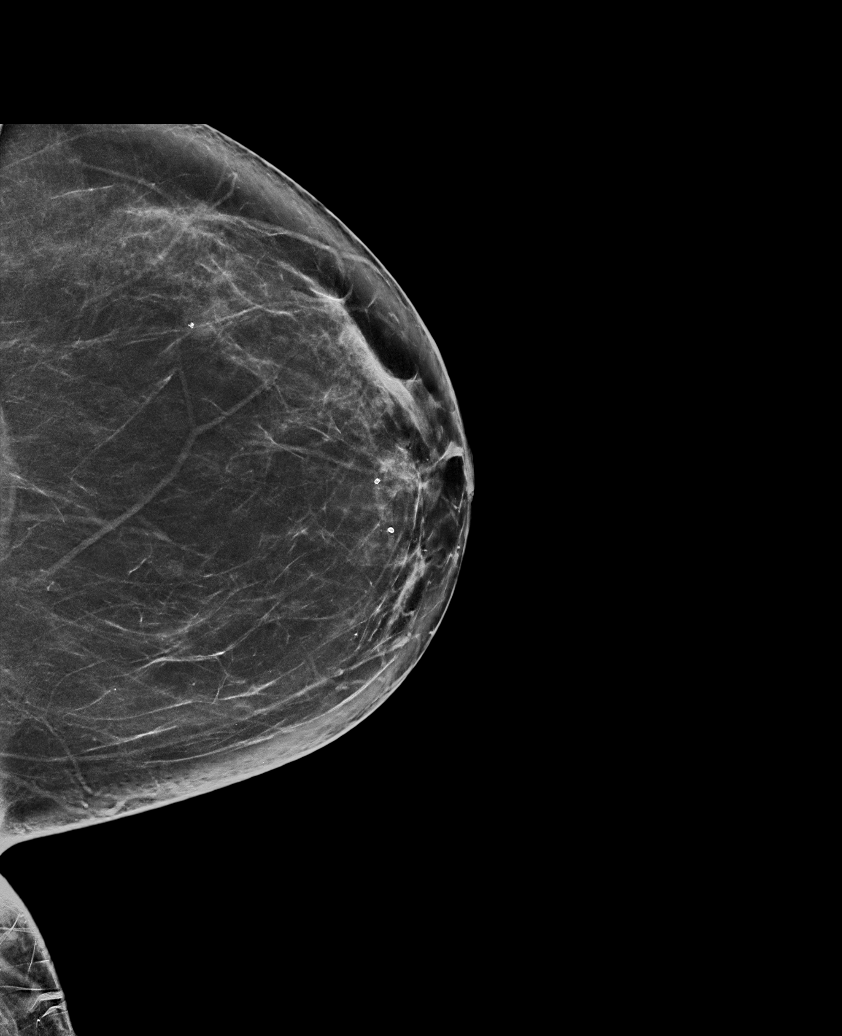

[R MLO synth-2D]
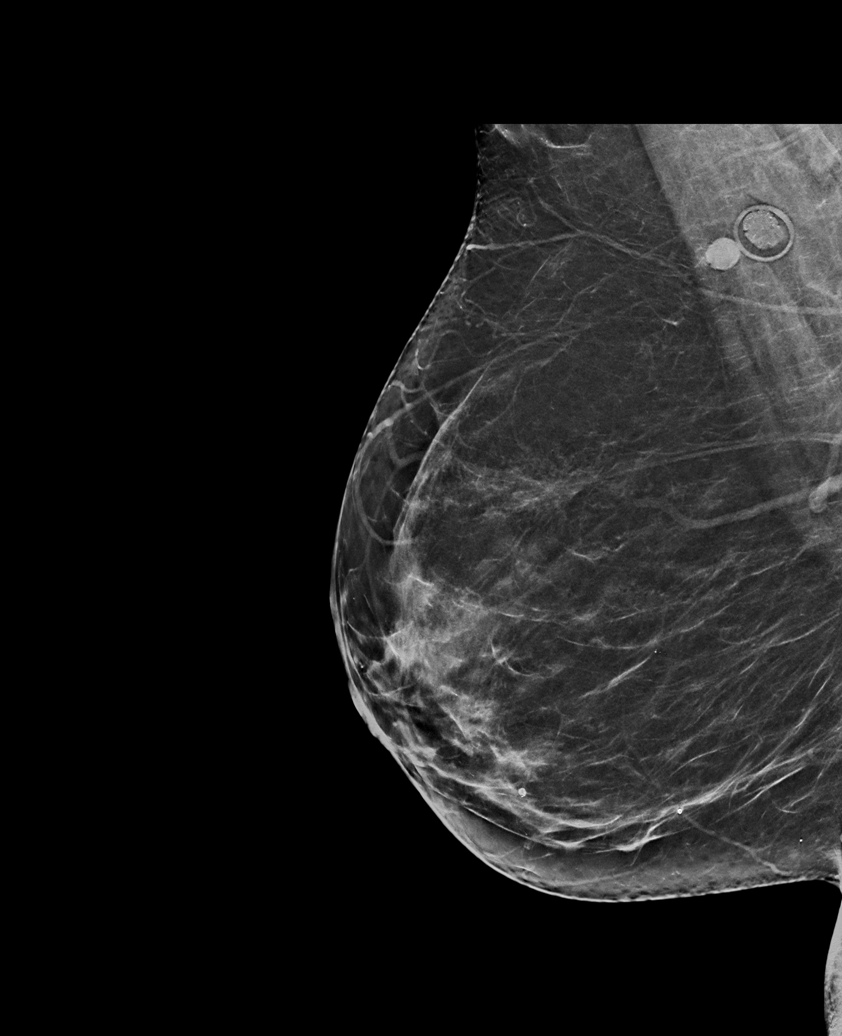

[L CC tomo · tomo slice 39/78.0]
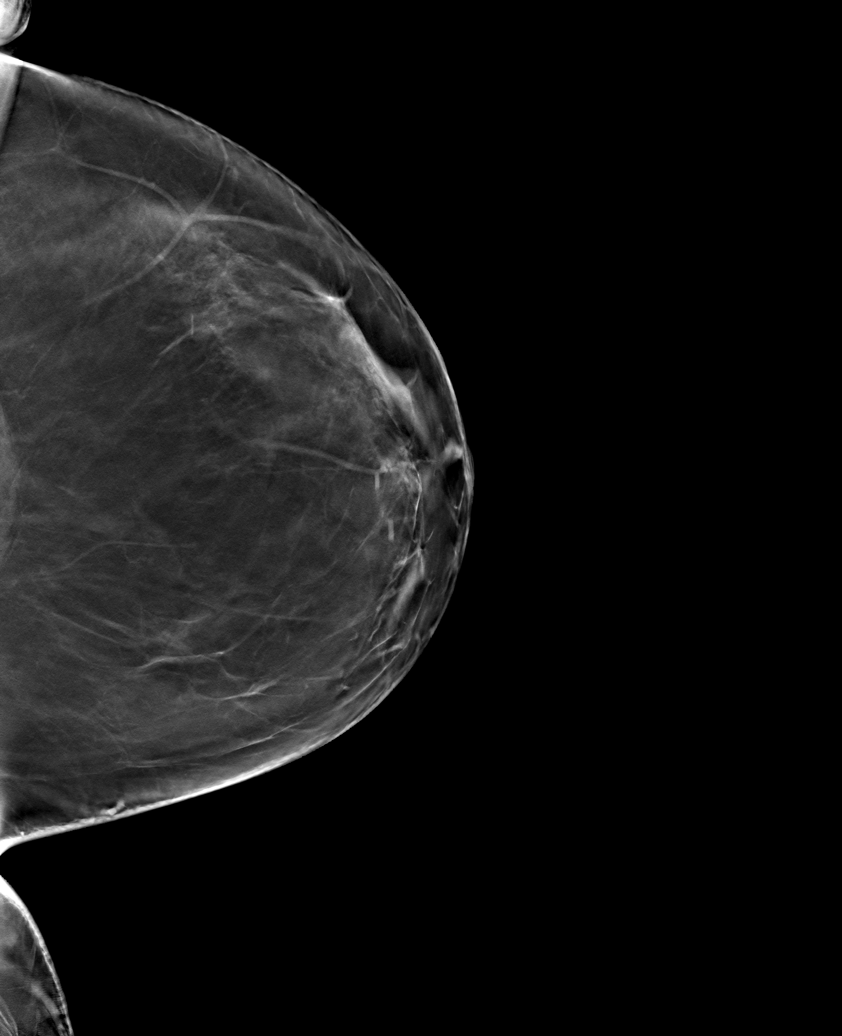

[6 of 30 positions shown; findings below may reference images not displayed]

ACR Breast Density Category b: There are scattered areas of
fibroglandular density.
FINDINGS: There are no findings suspicious for malignancy.
IMPRESSION: No mammographic evidence of malignancy. A result letter of this
screening mammogram will be mailed directly to the patient.

RECOMMENDATION:
Screening mammogram in one year. (Code:[BY])

BI-RADS CATEGORY  1: Negative.

## 2020-12-15 ENCOUNTER — Telehealth: Payer: Self-pay

## 2020-12-15 NOTE — Telephone Encounter (Signed)
-----   Message from Charlestine Massed sent at 12/07/2020 12:35 PM EST ----- This patient called and LM that she was in ER recently and told to see [place] this week. Can you advise?

## 2020-12-15 NOTE — Telephone Encounter (Signed)
Called patient to offer appt for 2/24. No answer. Left message to return call

## 2020-12-21 ENCOUNTER — Encounter: Payer: BC Managed Care – PPO | Admitting: Advanced Practice Midwife

## 2020-12-24 ENCOUNTER — Telehealth: Payer: Self-pay

## 2020-12-24 NOTE — Telephone Encounter (Signed)
forwarded to scheduling to attempt to reach patient again.

## 2021-01-07 NOTE — Telephone Encounter (Signed)
Tried to call patient on 12/31/20 to see if she still needed appt. Left message for her to call back.

## 2021-01-21 ENCOUNTER — Encounter: Payer: Self-pay | Admitting: Gastroenterology

## 2021-01-21 ENCOUNTER — Ambulatory Visit: Payer: BC Managed Care – PPO | Admitting: Gastroenterology

## 2021-01-21 VITALS — BP 122/78 | HR 76 | Ht 65.0 in | Wt 193.8 lb

## 2021-01-21 DIAGNOSIS — K921 Melena: Secondary | ICD-10-CM | POA: Diagnosis not present

## 2021-01-21 DIAGNOSIS — R1011 Right upper quadrant pain: Secondary | ICD-10-CM

## 2021-01-21 DIAGNOSIS — K219 Gastro-esophageal reflux disease without esophagitis: Secondary | ICD-10-CM | POA: Diagnosis not present

## 2021-01-21 MED ORDER — PLENVU 140 G PO SOLR: 1.0000 | Freq: Once | ORAL | 0 refills | Status: AC

## 2021-01-21 NOTE — Progress Notes (Signed)
History of Present Illness: This is a 50 year old female referred by Pablo Lawrence, NP for the evaluation of right sided abdominal pain and rectal bleeding.  She was evaluated in the ED on February 7 for acute right sided abdominal pain.  CT AP showed possible finding of enteritis and no other significant GI abnormalities were noted.  Her pain resolved within 1 to 2 days and has not recurred.  CMP, CBC, lipase were normal in February.  She had an episode of mild, bright red rectal bleeding and was evaluated by her PCP.  A hemorrhoid was noted on DRE.  The bleeding has not recurred.  No other gastrointestinal complaints. Denies weight loss, constipation, diarrhea, change in stool caliber, melena,  nausea, vomiting, dysphagia, reflux symptoms, chest pain.    Allergies  Allergen Reactions  . Ibuprofen Shortness Of Breath and Rash   Outpatient Medications Prior to Visit  Medication Sig Dispense Refill  . cetirizine (ZYRTEC) 10 MG tablet Take 10 mg by mouth 2 (two) times daily.    Marland Kitchen escitalopram (LEXAPRO) 5 MG tablet Take 5 mg by mouth daily.    . famotidine (PEPCID) 20 MG tablet Take 1 tablet (20 mg total) by mouth 2 (two) times daily as needed for heartburn or indigestion. 60 tablet 5  . HYDROcodone-acetaminophen (NORCO/VICODIN) 5-325 MG tablet Take 1-2 tablets by mouth every 6 (six) hours as needed. 6 tablet 0  . rosuvastatin (CRESTOR) 5 MG tablet Take 5 mg by mouth daily.    Marland Kitchen ALPRAZolam (XANAX) 0.25 MG tablet Take 0.25 mg by mouth daily as needed.    Marland Kitchen atenolol (TENORMIN) 25 MG tablet Take by mouth daily.    Marland Kitchen azithromycin (ZITHROMAX) 250 MG tablet Take 1 tablet (250 mg total) by mouth daily. Take first 2 tablets together, then 1 every day until finished. 6 tablet 0  . levocetirizine (XYZAL) 5 MG tablet Take 1 tablet (5 mg total) by mouth daily as needed for allergies. 60 tablet 5  . omeprazole (PRILOSEC) 20 MG capsule Take 1 capsule (20 mg total) by mouth in the morning and at bedtime for  14 days. 30 capsule 0   No facility-administered medications prior to visit.   Past Medical History:  Diagnosis Date  . Angioedema 02/24/2020  . Anxiety   . Depression   . GERD (gastroesophageal reflux disease)   . Hyperthyroidism    controlled with meds - per pt in remission  . Sebaceous cyst 09/2018   Right posterior neck  . Urticaria    Past Surgical History:  Procedure Laterality Date  . BREAST LUMPECTOMY Right 07/16/2019   Procedure: RIGHT BREAST LUMPECTOMY;  Surgeon: Donnie Mesa, MD;  Location: [place];  Service: General;  Laterality: Right;  . BREAST LUMPECTOMY    . MASS EXCISION Right 10/11/2018   Procedure: EXCISION OF SEBACEOUS CYST - RIGHT POSTERIOR NECK;  Surgeon: Donnie Mesa, MD;  Location: [place];  Service: General;  Laterality: Right;  . MOUTH SURGERY      2019 in dental office  . MOUTH SURGERY     Social History   Socioeconomic History  . Marital status: Married    Spouse name: Not on file  . Number of children: Not on file  . Years of education: Not on file  . Highest education level: Not on file  Occupational History  . Not on file  Tobacco Use  . Smoking status: Former Smoker    Quit date: 05/2017  Years since quitting: 3.6  . Smokeless tobacco: Never Used  Vaping Use  . Vaping Use: Never used  Substance and Sexual Activity  . Alcohol use: Yes    Alcohol/week: 1.0 standard drink    Types: 1 Cans of beer per week  . Drug use: Never  . Sexual activity: Yes    Birth control/protection: Other-see comments    Comment: husband had vasectomy  Other Topics Concern  . Not on file  Social History Narrative  . Not on file   Social Determinants of Health   Financial Resource Strain: Not on file  Food Insecurity: Not on file  Transportation Needs: Not on file  Physical Activity: Not on file  Stress: Not on file  Social Connections: Not on file   Family History  Problem Relation Age of Onset  .  Diabetes Mother   . Heart disease Mother   . Kidney disease Mother   . Melanoma Father   . Diabetes Sister   . Heart disease Sister   . Lung disease Sister   . Angioedema Neg Hx   . Allergic rhinitis Neg Hx   . Asthma Neg Hx   . Atopy Neg Hx   . Eczema Neg Hx   . Immunodeficiency Neg Hx   . Urticaria Neg Hx       Review of Systems: Pertinent positive and negative review of systems were noted in the above HPI section. All other review of systems were otherwise negative.   Physical Exam: General: Well developed, well nourished, no acute distress Head: Normocephalic and atraumatic Eyes: Sclerae anicteric, EOMI Ears: Normal auditory acuity Mouth: Not examined, mask on during Covid-19 pandemic Neck: Supple, no masses or thyromegaly Lungs: Clear throughout to auscultation Heart: Regular rate and rhythm; no murmurs, rubs or bruits Abdomen: Soft, non tender and non distended. No masses, hepatosplenomegaly or hernias noted. Normal Bowel sounds Rectal: Deferred to colonoscopy Musculoskeletal: Symmetrical with no gross deformities  Skin: No lesions on visible extremities Pulses:  Normal pulses noted Extremities: No clubbing, cyanosis, edema or deformities noted Neurological: Alert oriented x 4, grossly nonfocal Cervical Nodes:  No significant cervical adenopathy Inguinal Nodes: No significant inguinal adenopathy Psychological:  Alert and cooperative. Normal mood and affect   Assessment and Recommendations:  1. Right sided abdominal pain, resolved.  Suspected acute enteritis or other mild self-limited process.  No further evaluation necessary at this time.  2. Hematochezia.  Likely hemorrhoidal bleeding.  No prior colonoscopy.  Rule out colorectal neoplasms.  Schedule colonoscopy. The risks (including bleeding, perforation, infection, missed lesions, medication reactions and possible hospitalization or surgery if complications occur), benefits, and alternatives to colonoscopy with  possible biopsy and possible polypectomy were discussed with the patient and they consent to proceed.   3. GERD.  Follow antireflux measures and continue famotidine 20 mg p.o. twice daily as needed.  If symptoms worsen and are not controlled on this regimen consider EGD for further evaluation.   cc: Pablo Lawrence, NP [address] [place],  [place] [address]

## 2021-01-21 NOTE — Patient Instructions (Signed)
You have been scheduled for a colonoscopy. Please follow written instructions given to you at your visit today.  Please pick up your prep supplies at the pharmacy within the next 1-3 days. If you use inhalers (even only as needed), please bring them with you on the day of your procedure.  Thank you for choosing me and [facility].  Malcolm T. Stark, Jr., MD., FACG  

## 2021-03-23 ENCOUNTER — Encounter: Payer: BC Managed Care – PPO | Admitting: Gastroenterology

## 2021-03-31 ENCOUNTER — Telehealth: Payer: Self-pay | Admitting: Gastroenterology

## 2021-03-31 MED ORDER — PLENVU 140 G PO SOLR: 1.0000 | Freq: Once | ORAL | 0 refills | Status: AC

## 2021-03-31 NOTE — Telephone Encounter (Signed)
Prescription sent to patient's pharmacy.

## 2021-04-20 ENCOUNTER — Telehealth: Payer: Self-pay | Admitting: Gastroenterology

## 2021-04-20 ENCOUNTER — Encounter: Payer: BC Managed Care – PPO | Admitting: Gastroenterology

## 2021-04-20 NOTE — Telephone Encounter (Signed)
Please return the call for me.  Recommend that she keeps her appt for colonoscopy tomorrow and that she starts H pylori treatment after her colonoscopy is completed. My procedure schedule is full tomorrow so unfortunately we cannot add on an EGD. We can schedule EGD for another time when she is here tomorrow.

## 2021-04-20 NOTE — Telephone Encounter (Signed)
Spoke with Pablo Lawrence, NP in regards to Dr. Lynne Leader recommendations. Courtney verbalized understanding and will reach out to the patient to relay recommendations.

## 2021-04-20 NOTE — Telephone Encounter (Signed)
Pablo Lawrence, NP and pt's PCP called to inform Dr. Fuller Plan that pt was dx with H-Pylori and has been dealing with the sxs for the past 6 months so she would like to know if Dr. Fuller Plan would consider doing EGD, too even if procedure that is scheduled for tomorrow needs to be r/s.  Pls call Courtney at [phone].

## 2021-04-21 ENCOUNTER — Ambulatory Visit (AMBULATORY_SURGERY_CENTER): Payer: BC Managed Care – PPO | Admitting: Gastroenterology

## 2021-04-21 ENCOUNTER — Encounter: Payer: Self-pay | Admitting: Gastroenterology

## 2021-04-21 ENCOUNTER — Other Ambulatory Visit: Payer: Self-pay

## 2021-04-21 VITALS — BP 112/74 | HR 67 | Temp 97.8°F | Resp 18 | Ht 65.0 in | Wt 193.0 lb

## 2021-04-21 DIAGNOSIS — K921 Melena: Secondary | ICD-10-CM | POA: Diagnosis not present

## 2021-04-21 DIAGNOSIS — R1011 Right upper quadrant pain: Secondary | ICD-10-CM | POA: Diagnosis not present

## 2021-04-21 DIAGNOSIS — D128 Benign neoplasm of rectum: Secondary | ICD-10-CM

## 2021-04-21 DIAGNOSIS — D123 Benign neoplasm of transverse colon: Secondary | ICD-10-CM | POA: Diagnosis not present

## 2021-04-21 DIAGNOSIS — K648 Other hemorrhoids: Secondary | ICD-10-CM | POA: Diagnosis not present

## 2021-04-21 DIAGNOSIS — D125 Benign neoplasm of sigmoid colon: Secondary | ICD-10-CM

## 2021-04-21 DIAGNOSIS — D124 Benign neoplasm of descending colon: Secondary | ICD-10-CM | POA: Diagnosis not present

## 2021-04-21 DIAGNOSIS — K635 Polyp of colon: Secondary | ICD-10-CM

## 2021-04-21 MED ORDER — GLYCOPYRROLATE 2 MG PO TABS: 2.0000 mg | ORAL_TABLET | Freq: Two times a day (BID) | ORAL | 3 refills | Status: DC

## 2021-04-21 MED ORDER — SODIUM CHLORIDE 0.9 % IV SOLN: 500.0000 mL | Freq: Once | INTRAVENOUS | Status: DC

## 2021-04-21 NOTE — Progress Notes (Signed)
VS-CW 

## 2021-04-21 NOTE — Patient Instructions (Signed)
YOU HAD AN ENDOSCOPIC PROCEDURE TODAY AT THE  ENDOSCOPY CENTER:   Refer to the procedure report that was given to you for any specific questions about what was found during the examination.  If the procedure report does not answer your questions, please call your gastroenterologist to clarify.  If you requested that your care partner not be given the details of your procedure findings, then the procedure report has been included in a sealed envelope for you to review at your convenience later.  YOU SHOULD EXPECT: Some feelings of bloating in the abdomen. Passage of more gas than usual.  Walking can help get rid of the air that was put into your GI tract during the procedure and reduce the bloating. If you had a lower endoscopy (such as a colonoscopy or flexible sigmoidoscopy) you may notice spotting of blood in your stool or on the toilet paper. If you underwent a bowel prep for your procedure, you may not have a normal bowel movement for a few days.  Please Note:  You might notice some irritation and congestion in your nose or some drainage.  This is from the oxygen used during your procedure.  There is no need for concern and it should clear up in a day or so.  SYMPTOMS TO REPORT IMMEDIATELY:   Following lower endoscopy (colonoscopy or flexible sigmoidoscopy):  Excessive amounts of blood in the stool  Significant tenderness or worsening of abdominal pains  Swelling of the abdomen that is new, acute  Fever of 100F or higher   Following upper endoscopy (EGD)  Vomiting of blood or coffee ground material  New chest pain or pain under the shoulder blades  Painful or persistently difficult swallowing  New shortness of breath  Fever of 100F or higher  Black, tarry-looking stools  For urgent or emergent issues, a gastroenterologist can be reached at any hour by calling [phone]. Do not use MyChart messaging for urgent concerns.    DIET:  We do recommend a small meal at first, but  then you may proceed to your regular diet.  Drink plenty of fluids but you should avoid alcoholic beverages for 24 hours.  ACTIVITY:  You should plan to take it easy for the rest of today and you should NOT DRIVE or use heavy machinery until tomorrow (because of the sedation medicines used during the test).    FOLLOW UP: Our staff will call the number listed on your records 48-72 hours following your procedure to check on you and address any questions or concerns that you may have regarding the information given to you following your procedure. If we do not reach you, we will leave a message.  We will attempt to reach you two times.  During this call, we will ask if you have developed any symptoms of COVID 19. If you develop any symptoms (ie: fever, flu-like symptoms, shortness of breath, cough etc.) before then, please call [phone].  If you test positive for Covid 19 in the 2 weeks post procedure, please call and report this information to us.    If any biopsies were taken you will be contacted by phone or by letter within the next 1-3 weeks.  Please call us at [phone] if you have not heard about the biopsies in 3 weeks.    SIGNATURES/CONFIDENTIALITY: You and/or your care partner have signed paperwork which will be entered into your electronic medical record.  These signatures attest to the fact that that the information above on   your After Visit Summary has been reviewed and is understood.  Full responsibility of the confidentiality of this discharge information lies with you and/or your care-partner. 

## 2021-04-21 NOTE — Progress Notes (Signed)
PT taken to PACU. Monitors in place. VSS. Report given to RN. 

## 2021-04-21 NOTE — Progress Notes (Signed)
Called to room to assist during endoscopic procedure.  Patient ID and intended procedure confirmed with present staff. Received instructions for my participation in the procedure from the performing physician.  

## 2021-04-21 NOTE — Op Note (Signed)
[facility] Patient Name: Christina Brown Procedure Date: 04/21/2021 9:00 AM MRN: [id number] Endoscopist: Ladene Artist , MD Age: 50 Referring MD:  Date of Birth: 09-Jul-1971 Gender: Female Account #: [id number] Procedure:                Colonoscopy Indications:              Hematochezia Medicines:                Monitored Anesthesia Care Procedure:                Pre-Anesthesia Assessment:                           - Prior to the procedure, a History and Physical                            was performed, and patient medications and                            allergies were reviewed. The patient's tolerance of                            previous anesthesia was also reviewed. The risks                            and benefits of the procedure and the sedation                            options and risks were discussed with the patient.                            All questions were answered, and informed consent                            was obtained. Prior Anticoagulants: The patient has                            taken no previous anticoagulant or antiplatelet                            agents. ASA Grade Assessment: II - A patient with                            mild systemic disease. After reviewing the risks                            and benefits, the patient was deemed in                            satisfactory condition to undergo the procedure.                           After obtaining informed consent, the colonoscope  was passed under direct vision. Throughout the                            procedure, the patient's blood pressure, pulse, and                            oxygen saturations were monitored continuously. The                            Olympus CF-HQ190 [id number]) Colonoscope was                            introduced through the anus and advanced to the the                            cecum, identified by appendiceal orifice and                             ileocecal valve. The ileocecal valve, appendiceal                            orifice, and rectum were photographed. The quality                            of the bowel preparation was excellent. The                            colonoscopy was performed without difficulty. The                            patient tolerated the procedure well. Scope In: 9:13:05 AM Scope Out: 9:31:42 AM Scope Withdrawal Time: 0 hours 15 minutes 58 seconds  Total Procedure Duration: 0 hours 18 minutes 37 seconds  Findings:                 The perianal and digital rectal examinations were                            normal.                           A 16 mm polyp was found in the transverse colon.                            The polyp was sessile. The polyp was removed with a                            piecemeal technique using a cold snare. Resection                            and retrieval were complete.                           A 10 mm polyp was found in the rectum. The polyp  was pedunculated. The polyp was removed with a hot                            snare. Resection and retrieval were complete.                           Three sessile polyps were found in the sigmoid                            colon (1) and descending colon (2). The polyps were                            7 to 8 mm in size. These polyps were removed with a                            cold snare. Resection and retrieval were complete.                           Internal hemorrhoids were found during                            retroflexion. The hemorrhoids were small and Grade                            I (internal hemorrhoids that do not prolapse).                           The exam was otherwise without abnormality on                            direct and retroflexion views. Complications:            No immediate complications. Estimated blood loss:                            None. Estimated  Blood Loss:     Estimated blood loss: none. Impression:               - One 16 mm polyp in the transverse colon, removed                            piecemeal using a cold snare. Resected and                            retrieved.                           - One 10 mm polyp in the rectum, removed with a hot                            snare. Resected and retrieved.                           - Three 7 to 8 mm polyps in the sigmoid colon and  in the descending colon, removed with a cold snare.                            Resected and retrieved.                           - Internal hemorrhoids.                           - The examination was otherwise normal on direct                            and retroflexion views. Recommendation:           - Repeat colonoscopy after studies are complete for                            surveillance based on pathology results.                           - Patient has a contact number available for                            emergencies. The signs and symptoms of potential                            delayed complications were discussed with the                            patient. Return to normal activities tomorrow.                            Written discharge instructions were provided to the                            patient.                           - Resume previous diet.                           - Continue present medications.                           - Await pathology results.                           - No aspirin, ibuprofen, naproxen, or other                            non-steroidal anti-inflammatory drugs for 2 weeks                            after polyp removal.                           - H. pylori stool antigen in 2 weeks.                           -  Robinul 2 mg po bid prn abdominal pain, 1 year of                            refills.                           - GI office appt in 2 months. Ladene Artist,  MD 04/21/2021 9:40:40 AM This report has been signed electronically.

## 2021-04-23 ENCOUNTER — Telehealth: Payer: Self-pay

## 2021-04-23 NOTE — Telephone Encounter (Signed)
  Follow up Call-  Call back number 04/21/2021  Post procedure Call Back phone  # [phone]  Permission to leave phone message Yes  Some recent data might be hidden     Patient questions:  Do you have a fever, pain , or abdominal swelling? No. Pain Score  0 *  Have you tolerated food without any problems? Yes.    Have you been able to return to your normal activities? Yes.    Do you have any questions about your discharge instructions: Diet   No. Medications  No. Follow up visit  No.  Do you have questions or concerns about your Care? No.  Actions: * If pain score is 4 or above: No action needed, pain <4.  Have you developed a fever since your procedure? no  2.   Have you had an respiratory symptoms (SOB or cough) since your procedure? no  3.   Have you tested positive for COVID 19 since your procedure no  4.   Have you had any family members/close contacts diagnosed with the COVID 19 since your procedure?  no   If yes to any of these questions please route to Joylene John, RN and Joella Prince, RN

## 2021-05-03 ENCOUNTER — Encounter: Payer: Self-pay | Admitting: Gastroenterology

## 2021-10-04 ENCOUNTER — Other Ambulatory Visit (HOSPITAL_COMMUNITY): Payer: Self-pay | Admitting: Adult Health Nurse Practitioner

## 2021-10-04 DIAGNOSIS — R1011 Right upper quadrant pain: Secondary | ICD-10-CM

## 2021-10-04 DIAGNOSIS — R1013 Epigastric pain: Secondary | ICD-10-CM

## 2021-10-05 ENCOUNTER — Ambulatory Visit (HOSPITAL_COMMUNITY)
Admission: RE | Admit: 2021-10-05 | Discharge: 2021-10-05 | Disposition: A | Discharge status: Home / Self Care | Payer: Self-pay | Source: Ambulatory Visit | Attending: Adult Health Nurse Practitioner | Admitting: Adult Health Nurse Practitioner

## 2021-10-05 ENCOUNTER — Other Ambulatory Visit: Payer: Self-pay

## 2021-10-05 DIAGNOSIS — R1011 Right upper quadrant pain: Secondary | ICD-10-CM | POA: Insufficient documentation

## 2021-10-05 DIAGNOSIS — R1013 Epigastric pain: Secondary | ICD-10-CM | POA: Insufficient documentation

## 2021-10-05 IMAGING — US US ABDOMEN LIMITED
1 series · 14 of 25 positions shown · non-contrast
Comparison: CT AP, [DATE].  MR abdomen, [DATE].

CLINICAL DATA: RIGHT upper quadrant pain x5 days.

EXAM:
ULTRASOUND ABDOMEN LIMITED RIGHT UPPER QUADRANT

[Series 1: us abdomen limited ruq (liver/gb) · 14 of 58 slices shown]
[im 1/58]
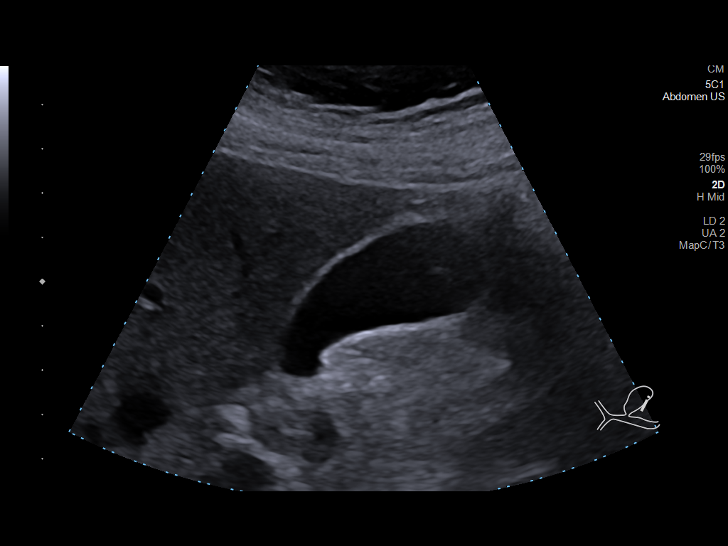
[im 5/58]
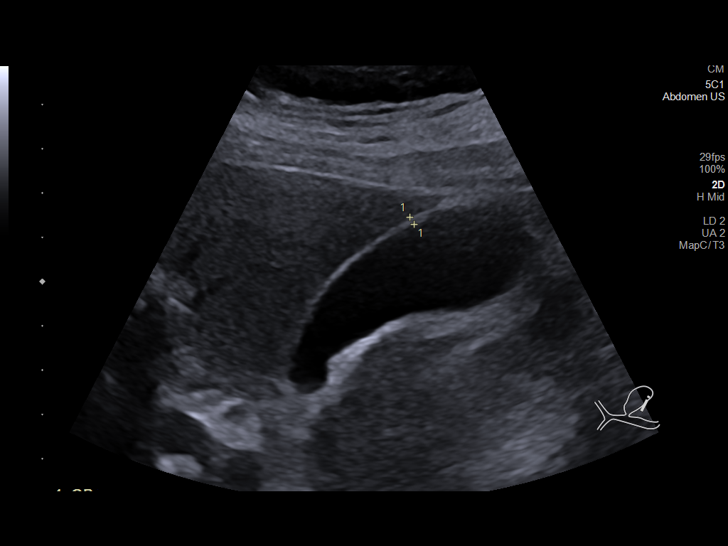
[im 10/58]
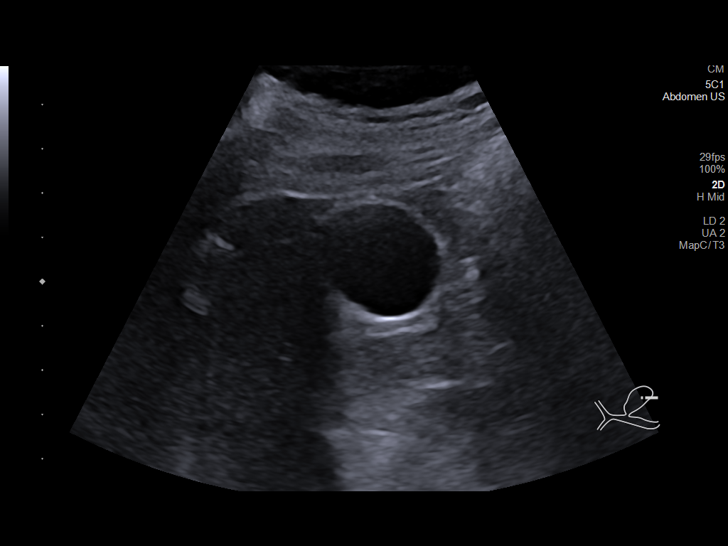
[im 15/58]
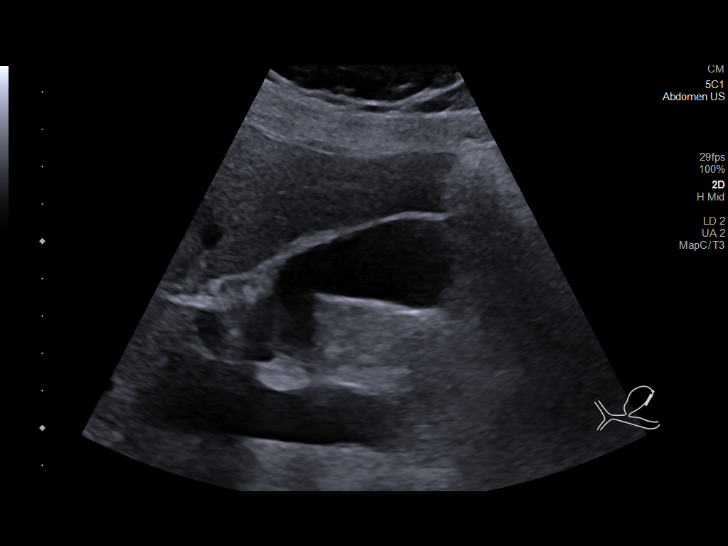
[im 20/58]
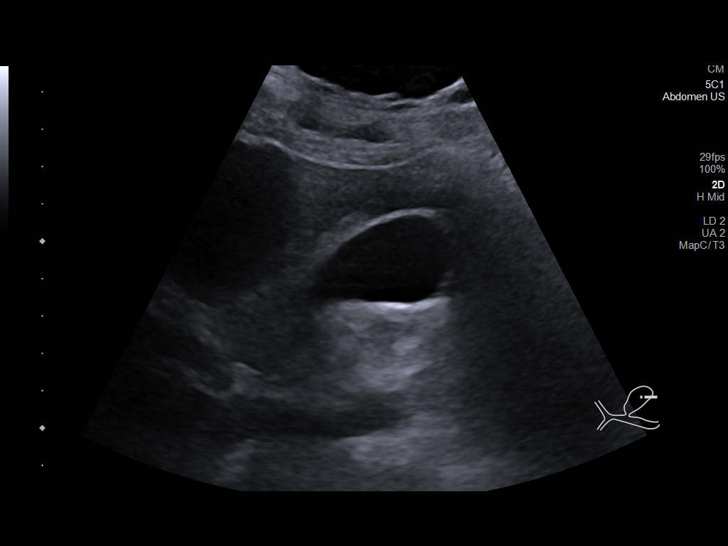
[im 22/58]
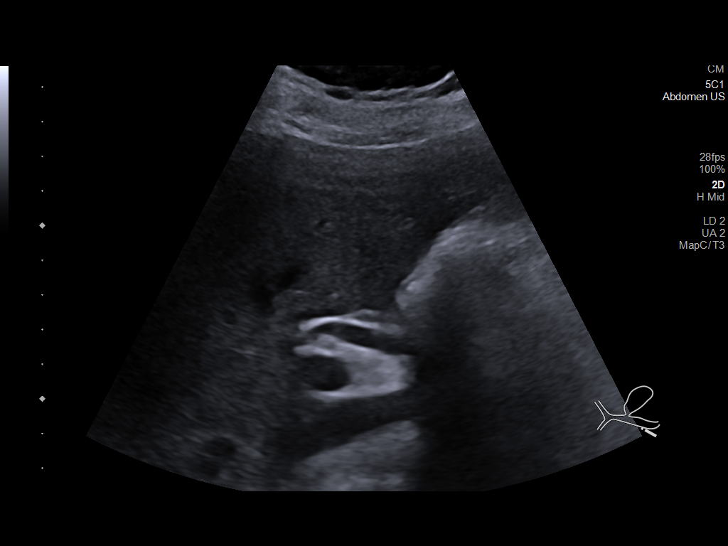
[im 27/58]
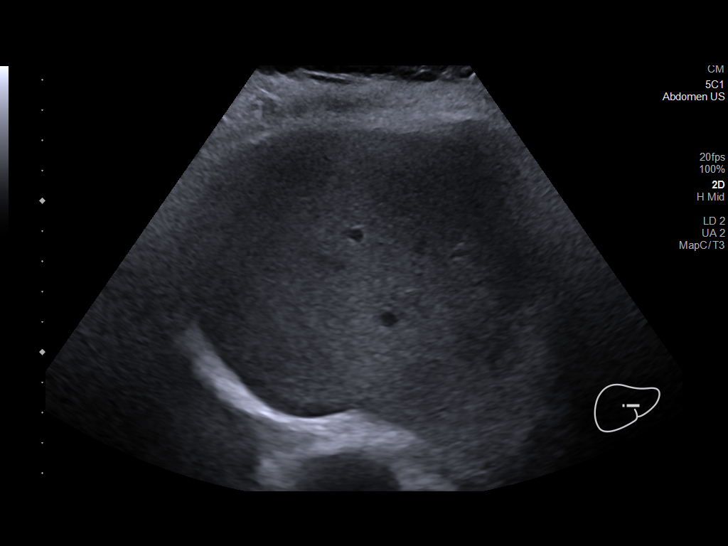
[im 31/58]
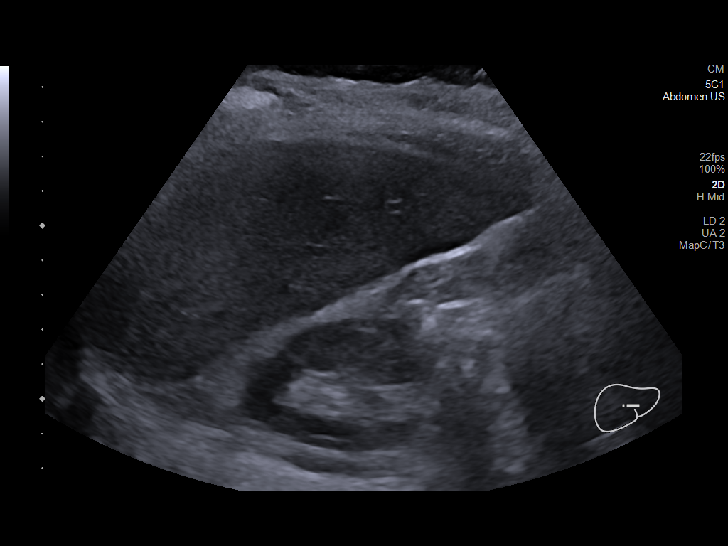
[im 36/58]
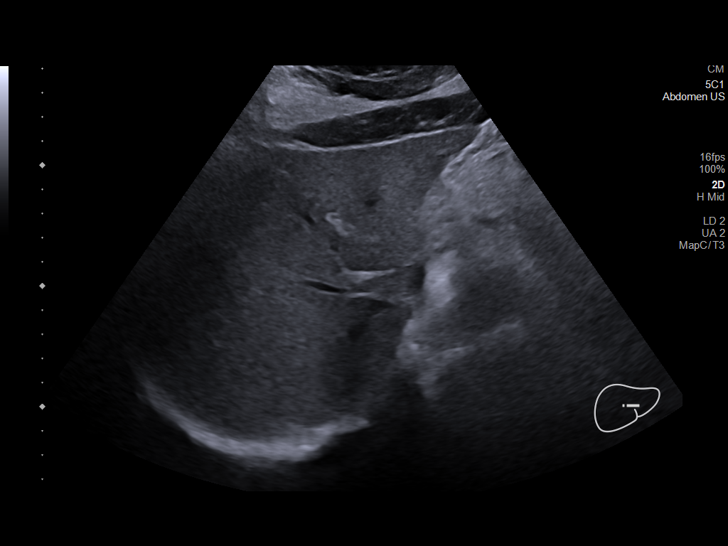
[im 39/58]
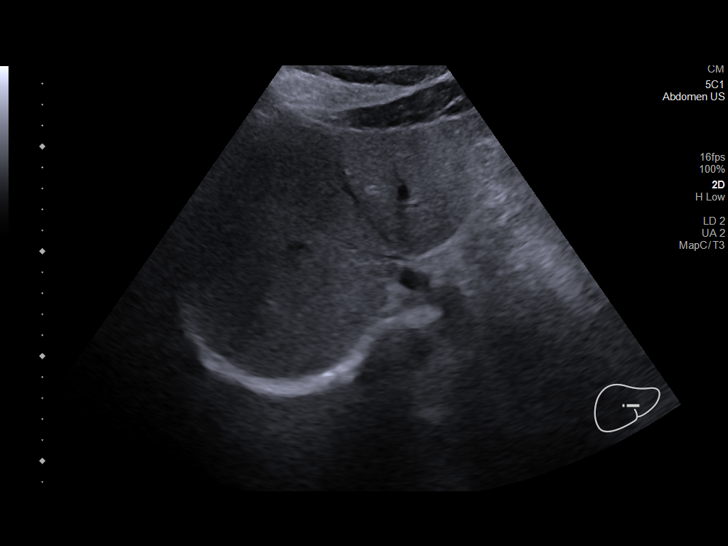
[im 43/58]
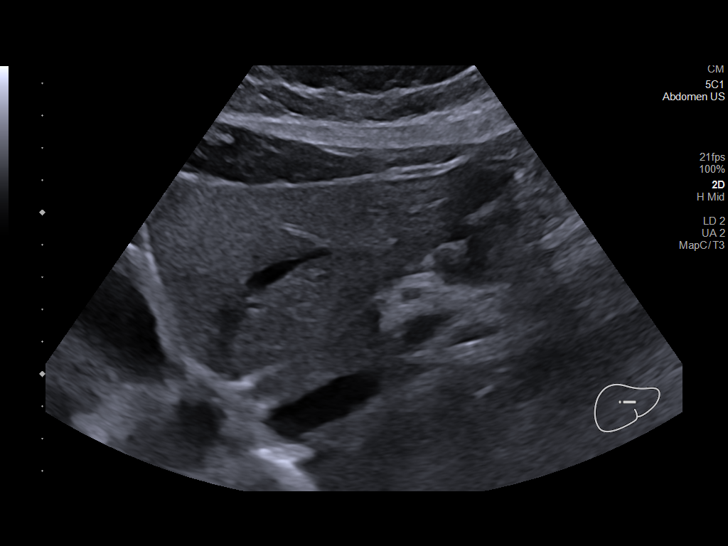
[im 48/58]
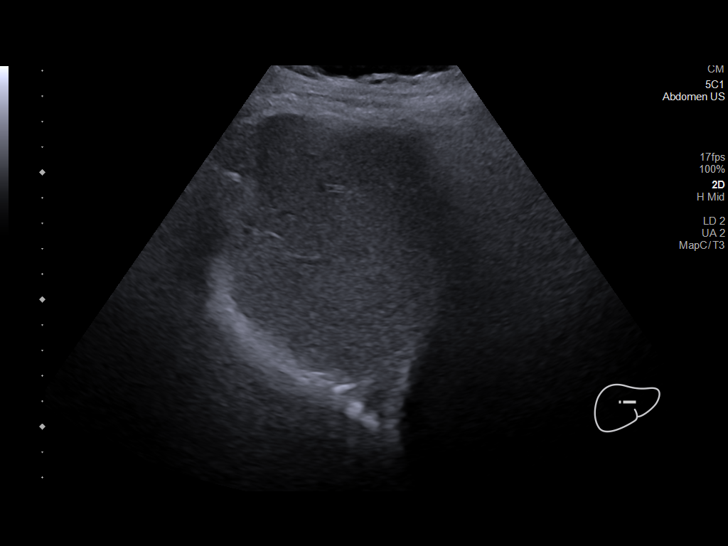
[im 53/58]
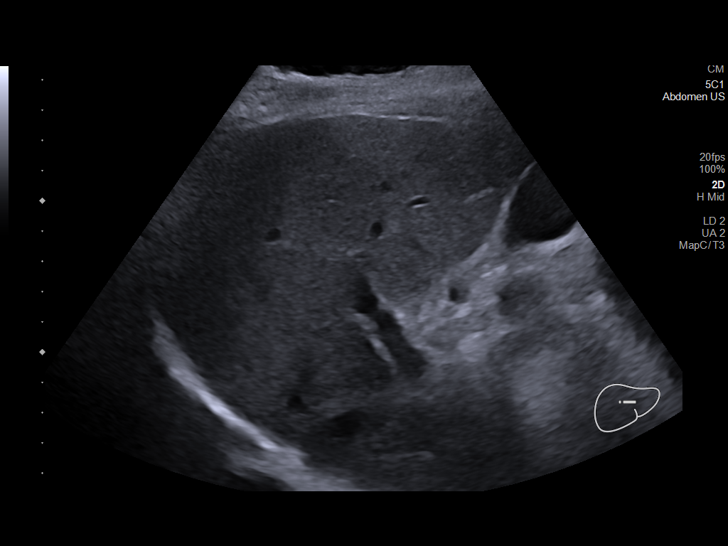
[im 58/58]
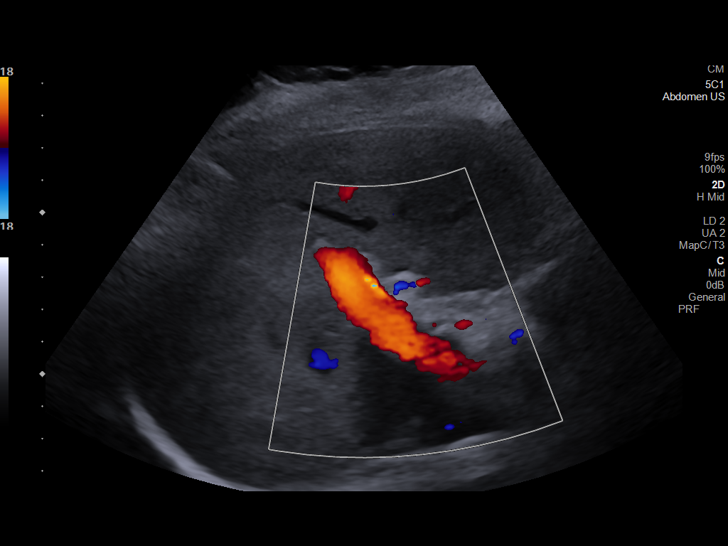

[14 of 25 positions shown; findings below may reference images not displayed]

FINDINGS: Gallbladder:

No gallstones or wall thickening visualized. No sonographic Murphy
sign noted by sonographer.

Common bile duct:

Diameter: 0.5 cm

Liver:

No focal lesion identified. Increased hepatic parenchymal
echogenicity. Portal vein is patent on color Doppler imaging with
normal direction of blood flow towards the liver.

Other: No perihepatic ascites.
IMPRESSION: 1. No acute sonographic findings within the RIGHT upper quadrant.
2. Echogenic liver. Findings most commonly seen in hepatic
steatosis, though may also represent hepatitis and/or fibrosis.

## 2021-10-27 ENCOUNTER — Other Ambulatory Visit: Payer: Self-pay | Admitting: Adult Health Nurse Practitioner

## 2021-12-09 ENCOUNTER — Other Ambulatory Visit: Payer: Self-pay | Admitting: Urology

## 2021-12-09 DIAGNOSIS — D49511 Neoplasm of unspecified behavior of right kidney: Secondary | ICD-10-CM

## 2021-12-27 ENCOUNTER — Other Ambulatory Visit: Payer: Self-pay

## 2021-12-27 ENCOUNTER — Ambulatory Visit
Admission: RE | Admit: 2021-12-27 | Discharge: 2021-12-27 | Disposition: A | Discharge status: Home / Self Care | Payer: Self-pay | Source: Ambulatory Visit | Attending: Urology | Admitting: Urology

## 2021-12-27 DIAGNOSIS — D49511 Neoplasm of unspecified behavior of right kidney: Secondary | ICD-10-CM

## 2021-12-27 IMAGING — MR MR ABDOMEN WO/W CM
17 series · 48 of 48 positions shown · IV contrast (multihance)
Comparison: MRI abdomen [DATE]

CLINICAL DATA: Right renal lesion follow-up

EXAM:
MRI ABDOMEN WITHOUT AND WITH CONTRAST
TECHNIQUE: Multiplanar multisequence MR imaging of the abdomen was performed
both before and after the administration of intravenous contrast.
CONTRAST:  15mL MULTIHANCE GADOBENATE DIMEGLUMINE 529 MG/ML IV SOLN

[Series 3: T2 · coronal · 5.0mm · 1.56mm/px · 1 of 36 slices shown (1 of 3)]
[im 1/36]
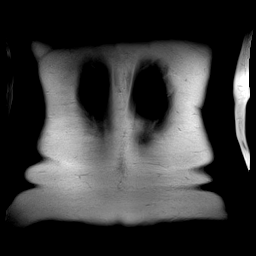

[Series 4: T1 · axial · 3.0mm · 1.19mm/px · z∈[-58,+155]mm · 5 of 144 slices shown]
[im 1/144]
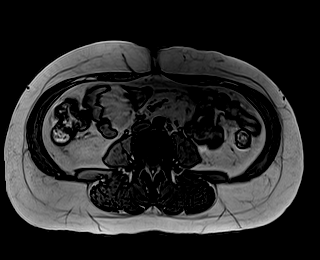
[im 36/144]
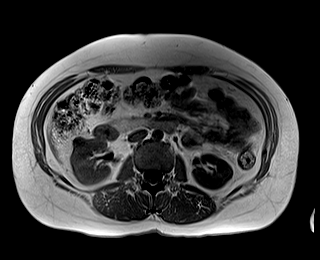
[im 72/144]
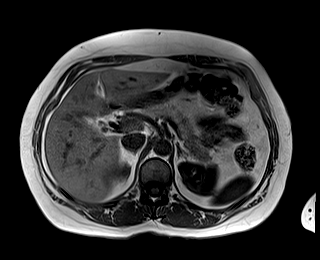
[im 108/144]
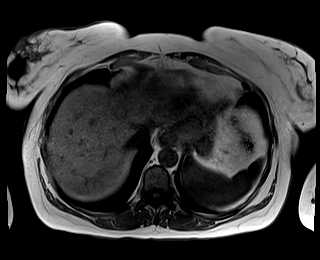
[im 144/144]
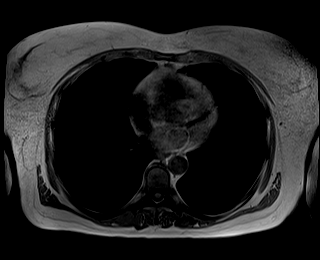

[Series 5: bSSFP · axial · 5.0mm · 1.25mm/px · z∈[-63,+159]mm · 2 of 38 slices shown]
[im 1/38]
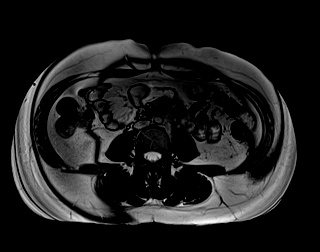
[im 38/38]
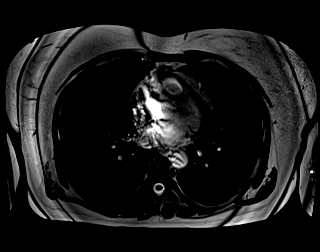

[Series 6: T2 · axial · 5.0mm · 1.48mm/px · z∈[-61,+179]mm · 2 of 41 slices shown (2 of 3)]
[im 1/41]
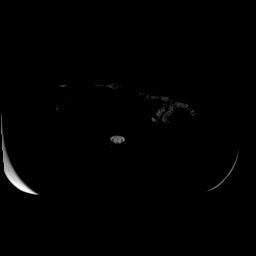
[im 41/41]
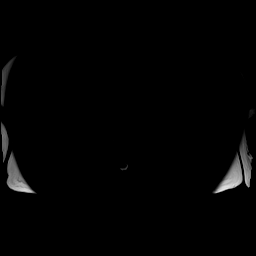

[Series 7: DWI · axial · 5.0mm · 1.42mm/px · z∈[-55,+173]mm · 5 of 117 slices shown (1 of 2)]
[im 1/117]
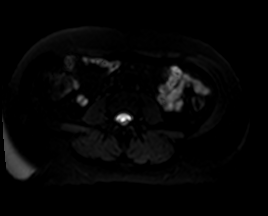
[im 30/117]
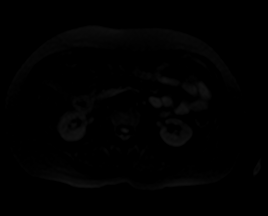
[im 59/117]
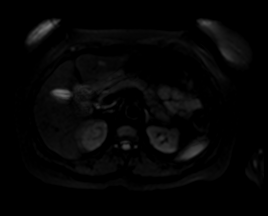
[im 88/117]
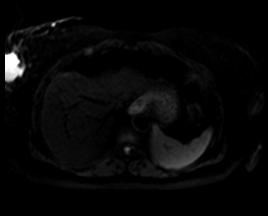
[im 117/117]
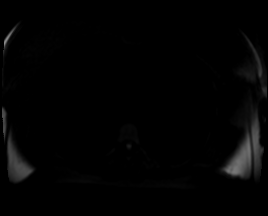

[Series 8: DWI · axial · 5.0mm · 1.42mm/px · z∈[-55,+173]mm · 2 of 39 slices shown (2 of 2)]
[im 1/39]
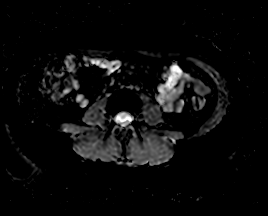
[im 39/39]
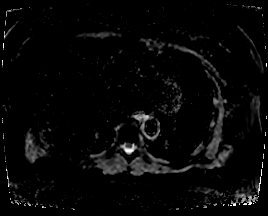

[Series 9: T2 · axial · 6.0mm · 1.19mm/px · 1 of 33 slices shown (3 of 3)]
[im 1/33]
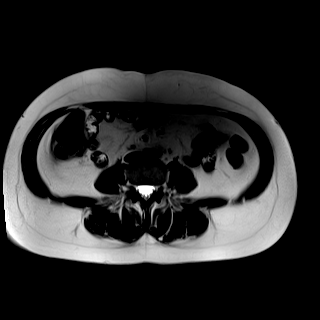

[Series 10: T1 dynamic · axial · non-contrast · 3.0mm · 1.25mm/px · z∈[-70,+143]mm · 3 of 72 slices shown]
[im 1/72]
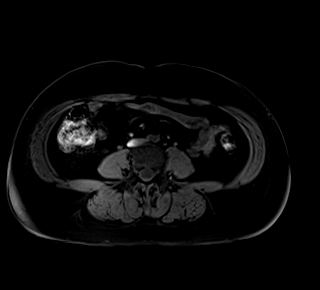
[im 36/72]
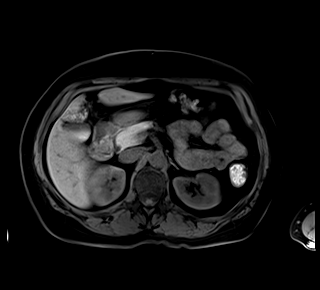
[im 72/72]
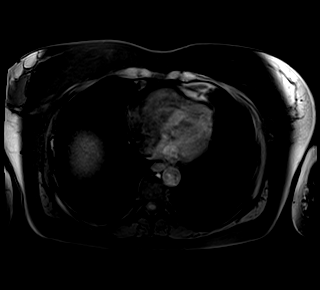

[Series 11: T1 dynamic post-contrast · axial · 3.0mm · 1.25mm/px · z∈[-70,+143]mm · 3 of 72 slices shown (1 of 9)]
[im 1/72]
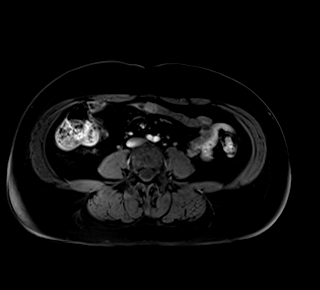
[im 36/72]
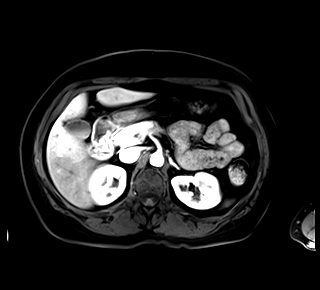
[im 72/72]
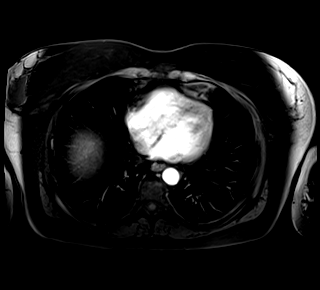

[Series 12: T1 dynamic post-contrast · axial · 3.0mm · 1.25mm/px · z∈[-70,+143]mm · 3 of 72 slices shown (2 of 9)]
[im 1/72]
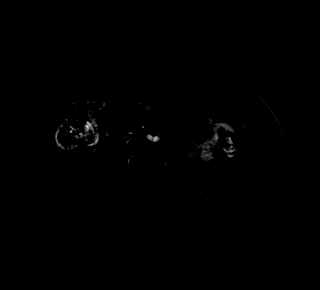
[im 36/72]
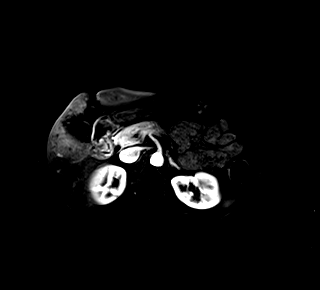
[im 72/72]
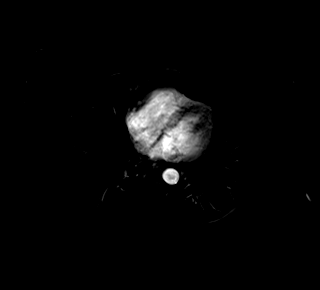

[Series 13: T1 dynamic post-contrast · axial · 3.0mm · 1.25mm/px · z∈[-70,+143]mm · 3 of 72 slices shown (3 of 9)]
[im 1/72]
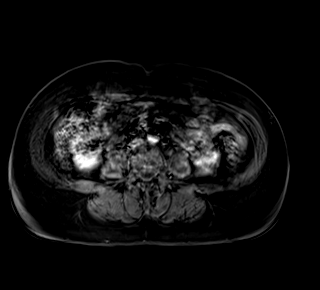
[im 36/72]
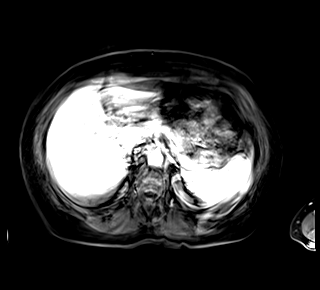
[im 72/72]
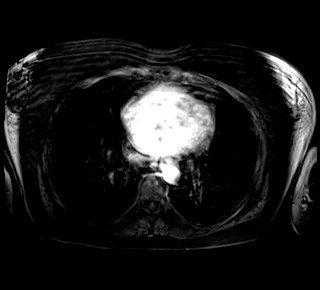

[Series 14: T1 dynamic post-contrast · axial · 3.0mm · 1.25mm/px · z∈[-70,+143]mm · 3 of 72 slices shown (4 of 9)]
[im 1/72]
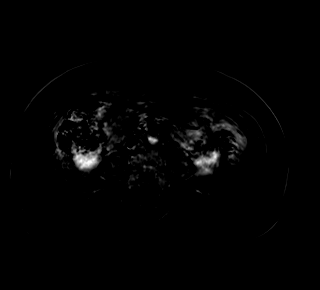
[im 36/72]
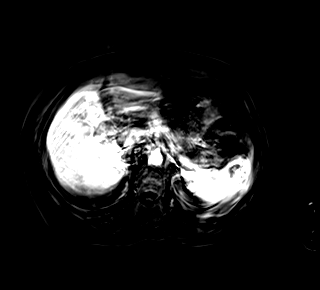
[im 72/72]
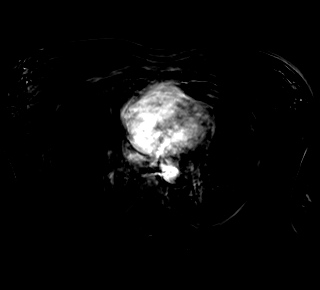

[Series 15: T1 dynamic post-contrast · axial · 3.0mm · 1.25mm/px · z∈[-70,+143]mm · 3 of 72 slices shown (5 of 9)]
[im 1/72]
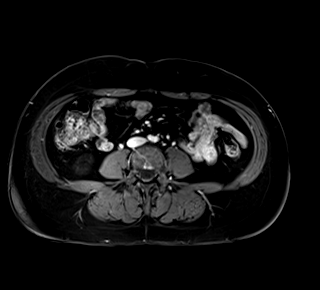
[im 36/72]
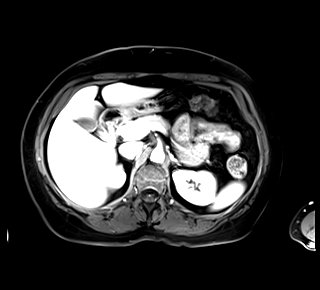
[im 72/72]
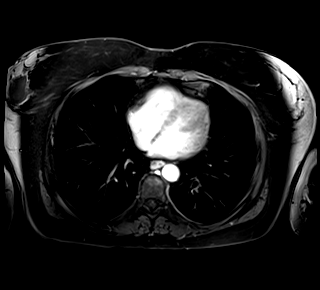

[Series 16: T1 dynamic post-contrast · axial · 3.0mm · 1.25mm/px · z∈[-70,+143]mm · 3 of 72 slices shown (6 of 9)]
[im 1/72]
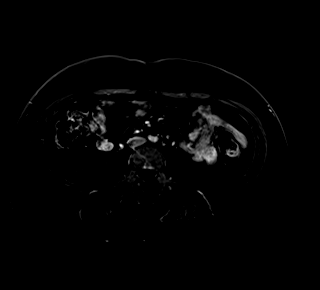
[im 36/72]
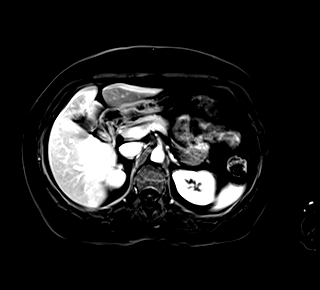
[im 72/72]
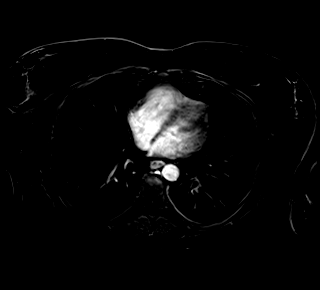

[Series 17: T1 dynamic post-contrast · coronal · 3.0mm · 1.25mm/px · 3 of 72 slices shown (7 of 9)]
[im 1/72]
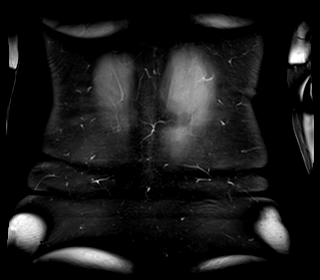
[im 36/72]
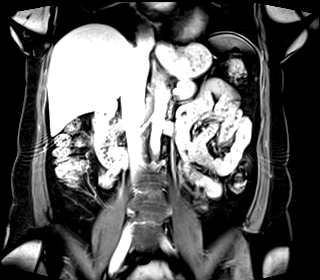
[im 72/72]
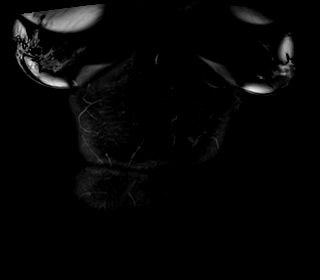

[Series 18: T1 dynamic post-contrast · axial · 3.0mm · 1.25mm/px · z∈[-70,+143]mm · 3 of 72 slices shown (8 of 9)]
[im 1/72]
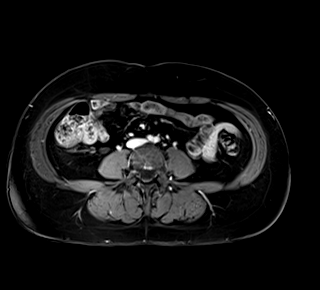
[im 36/72]
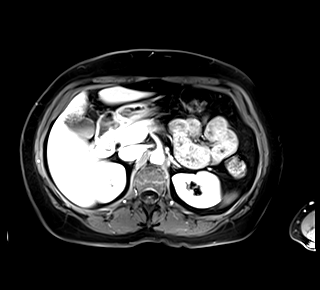
[im 72/72]
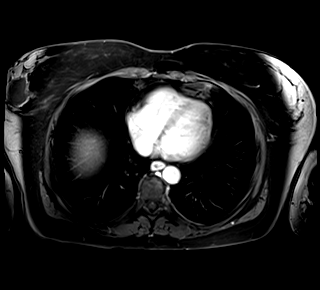

[Series 19: T1 dynamic post-contrast · axial · 3.0mm · 1.25mm/px · z∈[-70,+143]mm · 3 of 72 slices shown (9 of 9)]
[im 1/72]
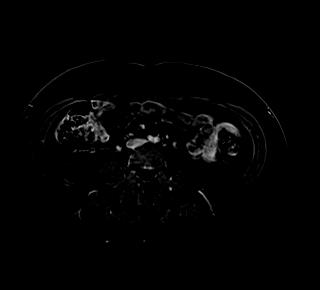
[im 36/72]
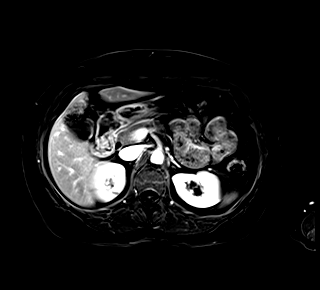
[im 72/72]
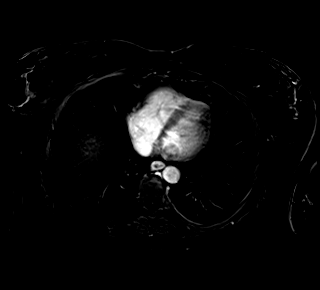

[48 of 48 positions shown; findings below may reference images not displayed]

FINDINGS: Lower chest: No acute findings. Approximately 4 cm cystic structure
in the right breast.

Hepatobiliary: Liver is normal in size and contour with no
suspicious mass identified. 11 mm cyst in the left hepatic lobe. No
evidence of hepatic steatosis. Gallbladder appears normal. No
biliary ductal dilatation identified.

Pancreas: No mass, inflammatory changes, or other parenchymal
abnormality identified.

Spleen:  Within normal limits in size and appearance.

Adrenals/Urinary Tract: Adrenal glands appear normal. Exophytic mass
at the anterior aspect of the right kidney again seen which now
measures 2.1 x 1.5 x 1.5 cm in axial and craniocaudal dimensions.
The mass demonstrates peripheral and heterogeneous internal
postcontrast enhancement. No other enhancing lesions identified. A
few tiny renal cortical cysts noted. No hydronephrosis.

Stomach/Bowel: Small hiatal hernia. Visualized bowel is
unremarkable.

Vascular/Lymphatic: No pathologically enlarged lymph nodes
identified. No abdominal aortic aneurysm demonstrated. Right renal
vein appears patent.

Other:  No ascites.

Musculoskeletal: No suspicious bone lesions identified.
IMPRESSION: 1. Exophytic heterogeneously enhancing mass at the anterior right
kidney measuring up to 2.1 cm in size. Should be considered renal
cell carcinoma until proven otherwise.
2. No lymphadenopathy.
3. Small hiatal hernia.
4. Note is made of a 4 cm cystic structure in the right breast,
correlate with mammography.

## 2021-12-27 MED ORDER — GADOBENATE DIMEGLUMINE 529 MG/ML IV SOLN
15.0000 mL | Freq: Once | INTRAVENOUS | Status: AC | PRN
  Administered 2021-12-27: 15 mL via INTRAVENOUS

## 2022-02-09 ENCOUNTER — Other Ambulatory Visit: Payer: Self-pay | Admitting: Urology

## 2022-04-14 NOTE — Progress Notes (Addendum)
COVID Vaccine Completed:  No  Date of COVID positive in last 90 days:  No  PCP - Pablo Lawrence, NP Cardiologist - N/A  Chest x-ray - N/A EKG - 10-05-21 CEW Stress Test - N/A ECHO - N/A Cardiac Cath - N/A Pacemaker/ICD device last checked: Spinal Cord Stimulator:  Bowel Prep - Clear liquid day before surgery and Miralax prep.  Patient aware and has instructions  Sleep Study - N/A CPAP -   Fasting Blood Sugar - N/A Checks Blood Sugar _____ times a day  Blood Thinner Instructions:N/A Aspirin Instructions: Last Dose:  Activity level:   Can go up a flight of stairs and perform activities of daily living without stopping and without symptoms of chest pain or shortness of breath.  Able to exercise without symptoms  Anesthesia review: N/A  Patient denies shortness of breath, fever, cough and chest pain at PAT appointment  Patient verbalized understanding of instructions that were given to them at the PAT appointment. Patient was also instructed that they will need to review over the PAT instructions again at home before surgery.

## 2022-04-14 NOTE — Patient Instructions (Addendum)
DUE TO COVID-19 ONLY TWO VISITORS  (aged 51 and older)  IS ALLOWED TO COME WITH YOU AND STAY IN THE WAITING ROOM ONLY DURING PRE OP AND PROCEDURE.   **NO VISITORS ARE ALLOWED IN THE SHORT STAY AREA OR RECOVERY ROOM!!**  IF YOU WILL BE ADMITTED INTO THE HOSPITAL YOU ARE ALLOWED ONLY FOUR SUPPORT PEOPLE DURING VISITATION HOURS ONLY (7 AM -8PM)   The support person(s) must pass our screening, gel in and out Visitors GUEST BADGE MUST BE WORN VISIBLY  One adult visitor may remain with you overnight and MUST be in the room by 8 P.M.   You are not required to [employer] often Do NOT share personal items Notify your provider if you are in close contact with someone who has COVID or you develop fever 100.4 or greater, new onset of sneezing, cough, sore throat, shortness of breath or body aches.        Your procedure is scheduled on:  04-21-22   Report to [facility] [address] Main Entrance    Report to admitting at 5:15 AM   Call this number if you have problems the morning of surgery [phone]   Follow a clear liquid diet the day before surgery   Do not eat food or drink liquids after midnight   Clear Liquid Diet Water Black Coffee (sugar ok, NO MILK/CREAM OR CREAMERS)  Tea (sugar ok, NO MILK/CREAM OR CREAMERS) regular and decaf                             Plain Jell-O (NO RED)                                           Fruit ices (not with fruit pulp, NO RED)                                     Popsicles (NO RED)                                                                  Juice: apple, WHITE grape, WHITE cranberry Sports drinks like Gatorade (NO RED) Clear broth(vegetable,chicken,beef)                     If you have questions, please contact your surgeon's office.   FOLLOW BOWEL PREP AND ANY ADDITIONAL PRE OP INSTRUCTIONS YOU RECEIVED FROM YOUR SURGEON'S OFFICE!!!  Miralax 255 g - Mix contents of container with 64 ounces Gatorade.     Oral Hygiene is also  important to reduce your risk of infection.                                    Remember - BRUSH YOUR TEETH THE MORNING OF SURGERY WITH YOUR REGULAR TOOTHPASTE   Do NOT smoke after Midnight   Take these medicines the morning of surgery with A SIP OF WATER: Escitalopram.  Okay to use nasal spray, Tylenol  and Alprazolam if needed   DO NOT [place]. PHARMACY WILL DISPENSE MEDICATIONS LISTED ON YOUR MEDICATION LIST TO YOU DURING YOUR ADMISSION [place]!                              You may not have any metal on your body including hair pins, jewelry, and body piercing             Do not wear make-up, lotions, powders, perfumes or deodorant  Do not wear nail polish including gel and S&S, artificial/acrylic nails, or any other type of covering on natural nails including finger and toenails. If you have artificial nails, gel coating, etc. that needs to be removed by a nail salon please have this removed prior to surgery or surgery may need to be canceled/ delayed if the surgeon/ anesthesia feels like they are unable to be safely monitored.   Do not shave  48 hours prior to surgery.        Contacts, dentures or bridgework may not be worn into surgery.   Bring small overnight bag day of surgery.  Do not bring valuables to the hospital. [place].  Please read over the following fact sheets you were given: IF YOU HAVE QUESTIONS ABOUT YOUR PRE-OP INSTRUCTIONS PLEASE CALL [facility] - Preparing for Surgery Before surgery, you can play an important role.  Because skin is not sterile, your skin needs to be as free of germs as possible.  You can reduce the number of germs on your skin by washing with CHG (chlorahexidine gluconate) soap before surgery.  CHG is an antiseptic cleaner which kills germs and bonds with the skin to continue killing germs even after washing. Please DO NOT use if you have an  allergy to CHG or antibacterial soaps.  If your skin becomes reddened/irritated stop using the CHG and inform your nurse when you arrive at Short Stay. Do not shave (including legs and underarms) for at least 48 hours prior to the first CHG shower.  You may shave your face/neck.  Please follow these instructions carefully:  1.  Shower with CHG Soap the night before surgery and the  morning of surgery.  2.  If you choose to wash your hair, wash your hair first as usual with your normal  shampoo.  3.  After you shampoo, rinse your hair and body thoroughly to remove the shampoo.                             4.  Use CHG as you would any other liquid soap.  You can apply chg directly to the skin and wash.  Gently with a scrungie or clean washcloth.  5.  Apply the CHG Soap to your body ONLY FROM THE NECK DOWN.   Do   not use on face/ open                           Wound or open sores. Avoid contact with eyes, ears mouth and   genitals (private parts).                       Wash face,  Genitals (private parts) with your normal soap.  6.  Wash thoroughly, paying special attention to the area where your    surgery  will be performed.  7.  Thoroughly rinse your body with warm water from the neck down.  8.  DO NOT shower/wash with your normal soap after using and rinsing off the CHG Soap.                9.  Pat yourself dry with a clean towel.            10.  Wear clean pajamas.            11.  Place clean sheets on your bed the night of your first shower and do not  sleep with pets. Day of Surgery : Do not apply any lotions/deodorants the morning of surgery.  Please wear clean clothes to the hospital/surgery center.  FAILURE TO FOLLOW THESE INSTRUCTIONS MAY RESULT IN THE CANCELLATION OF YOUR SURGERY  PATIENT SIGNATURE_________________________________  NURSE SIGNATURE__________________________________  ________________________________________________________________________    WHAT IS A  BLOOD TRANSFUSION? Blood Transfusion Information  A transfusion is the replacement of blood or some of its parts. Blood is made up of multiple cells which provide different functions. Red blood cells carry oxygen and are used for blood loss replacement. White blood cells fight against infection. Platelets control bleeding. Plasma helps clot blood. Other blood products are available for specialized needs, such as hemophilia or other clotting disorders. BEFORE THE TRANSFUSION  Who gives blood for transfusions?  Healthy volunteers who are fully evaluated to make sure their blood is safe. This is blood bank blood. Transfusion therapy is the safest it has ever been in the practice of medicine. Before blood is taken from a donor, a complete history is taken to make sure that person has no history of diseases nor engages in risky social behavior (examples are intravenous drug use or sexual activity with multiple partners). The donor's travel history is screened to minimize risk of transmitting infections, such as malaria. The donated blood is tested for signs of infectious diseases, such as HIV and hepatitis. The blood is then tested to be sure it is compatible with you in order to minimize the chance of a transfusion reaction. If you or a relative donates blood, this is often done in anticipation of surgery and is not appropriate for emergency situations. It takes many days to process the donated blood. RISKS AND COMPLICATIONS Although transfusion therapy is very safe and saves many lives, the main dangers of transfusion include:  Getting an infectious disease. Developing a transfusion reaction. This is an allergic reaction to something in the blood you were given. Every precaution is taken to prevent this. The decision to have a blood transfusion has been considered carefully by your caregiver before blood is given. Blood is not given unless the benefits outweigh the risks. AFTER THE TRANSFUSION Right  after receiving a blood transfusion, you will usually feel much better and more energetic. This is especially true if your red blood cells have gotten low (anemic). The transfusion raises the level of the red blood cells which carry oxygen, and this usually causes an energy increase. The nurse administering the transfusion will monitor you carefully for complications. HOME CARE INSTRUCTIONS  No special instructions are needed after a transfusion. You may find your energy is better. Speak with your caregiver about any limitations on activity for underlying diseases you may have. SEEK MEDICAL CARE IF:  Your condition is not improving after your transfusion. You develop redness or irritation  at the intravenous (IV) site. SEEK IMMEDIATE MEDICAL CARE IF:  Any of the following symptoms occur over the next 12 hours: Shaking chills. You have a temperature by mouth above 102 F (38.9 C), not controlled by medicine. Chest, back, or muscle pain. People around you feel you are not acting correctly or are confused. Shortness of breath or difficulty breathing. Dizziness and fainting. You get a rash or develop hives. You have a decrease in urine output. Your urine turns a dark color or changes to pink, red, or brown. Any of the following symptoms occur over the next 10 days: You have a temperature by mouth above 102 F (38.9 C), not controlled by medicine. Shortness of breath. Weakness after normal activity. The white part of the eye turns yellow (jaundice). You have a decrease in the amount of urine or are urinating less often. Your urine turns a dark color or changes to pink, red, or brown. Document Released: 10/14/2000 Document Revised: 01/09/2012 Document Reviewed: 06/02/2008 [facility] Patient Information 2014 [place], [place].  _______________________________________________________________________

## 2022-04-15 ENCOUNTER — Other Ambulatory Visit: Payer: Self-pay

## 2022-04-15 ENCOUNTER — Encounter (HOSPITAL_COMMUNITY): Payer: Self-pay

## 2022-04-15 ENCOUNTER — Encounter (HOSPITAL_COMMUNITY)
Admission: RE | Admit: 2022-04-15 | Discharge: 2022-04-15 | Disposition: A | Discharge status: Home / Self Care | Payer: BC Managed Care – PPO | Source: Ambulatory Visit | Attending: Urology | Admitting: Urology

## 2022-04-15 DIAGNOSIS — Z01818 Encounter for other preprocedural examination: Secondary | ICD-10-CM | POA: Diagnosis not present

## 2022-04-15 HISTORY — DX: Unspecified osteoarthritis, unspecified site: M19.90

## 2022-04-15 HISTORY — DX: Other specified disorders of kidney and ureter: N28.89

## 2022-04-15 LAB — COMPREHENSIVE METABOLIC PANEL
ALT: 14 U/L (ref 0–44)
AST: 19 U/L (ref 15–41)
Albumin: 4 g/dL (ref 3.5–5.0)
Alkaline Phosphatase: 49 U/L (ref 38–126)
Anion gap: 7 (ref 5–15)
BUN: 13 mg/dL (ref 6–20)
CO2: 24 mmol/L (ref 22–32)
Calcium: 8.9 mg/dL (ref 8.9–10.3)
Chloride: 107 mmol/L (ref 98–111)
Creatinine, Ser: 0.83 mg/dL (ref 0.44–1.00)
GFR, Estimated: >60 mL/min (ref >60)
Glucose, Bld: 126 mg/dL — ABNORMAL HIGH (ref 70–99)
Potassium: 3.4 mmol/L — ABNORMAL LOW (ref 3.5–5.1)
Sodium: 138 mmol/L (ref 135–145)
Total Bilirubin: 0.5 mg/dL (ref 0.3–1.2)
Total Protein: 7 g/dL (ref 6.5–8.1)

## 2022-04-15 LAB — CBC
HCT: 40.1 % (ref 36.0–46.0)
Hemoglobin: 13.2 g/dL (ref 12.0–15.0)
MCH: 30 pg (ref 26.0–34.0)
MCHC: 32.9 g/dL (ref 30.0–36.0)
MCV: 91.1 fL (ref 80.0–100.0)
Platelets: 248 10*3/uL (ref 150–400)
RBC: 4.4 MIL/uL (ref 3.87–5.11)
RDW: 12.9 % (ref 11.5–15.5)
WBC: 8.7 10*3/uL (ref 4.0–10.5)
nRBC: 0 % (ref 0.0–0.2)

## 2022-04-16 LAB — URINE CULTURE

## 2022-04-21 ENCOUNTER — Other Ambulatory Visit: Payer: Self-pay

## 2022-04-21 ENCOUNTER — Encounter (HOSPITAL_COMMUNITY): Payer: Self-pay | Admitting: Urology

## 2022-04-21 ENCOUNTER — Ambulatory Visit (HOSPITAL_COMMUNITY): Payer: BC Managed Care – PPO | Admitting: Certified Registered Nurse Anesthetist

## 2022-04-21 ENCOUNTER — Encounter (HOSPITAL_COMMUNITY)
Admission: RE | Disposition: A | Discharge status: Home / Self Care | Payer: Self-pay | Source: Ambulatory Visit | Attending: Urology

## 2022-04-21 ENCOUNTER — Observation Stay (HOSPITAL_COMMUNITY)
Admission: RE | Admit: 2022-04-21 | Discharge: 2022-04-22 | Disposition: A | Discharge status: Home / Self Care | Payer: BC Managed Care – PPO | Source: Ambulatory Visit | Attending: Urology | Admitting: Urology

## 2022-04-21 DIAGNOSIS — C641 Malignant neoplasm of right kidney, except renal pelvis: Secondary | ICD-10-CM | POA: Diagnosis not present

## 2022-04-21 DIAGNOSIS — N2889 Other specified disorders of kidney and ureter: Secondary | ICD-10-CM | POA: Diagnosis present

## 2022-04-21 DIAGNOSIS — E039 Hypothyroidism, unspecified: Secondary | ICD-10-CM | POA: Diagnosis not present

## 2022-04-21 DIAGNOSIS — Z79899 Other long term (current) drug therapy: Secondary | ICD-10-CM | POA: Insufficient documentation

## 2022-04-21 DIAGNOSIS — Z87891 Personal history of nicotine dependence: Secondary | ICD-10-CM | POA: Diagnosis not present

## 2022-04-21 HISTORY — PX: ROBOTIC ASSITED PARTIAL NEPHRECTOMY: SHX6087

## 2022-04-21 LAB — HEMOGLOBIN AND HEMATOCRIT, BLOOD
HCT: 38.8 % (ref 36.0–46.0)
Hemoglobin: 12.5 g/dL (ref 12.0–15.0)

## 2022-04-21 LAB — TYPE AND SCREEN
ABO/RH(D): O POS
Antibody Screen: NEGATIVE

## 2022-04-21 LAB — ABO/RH: ABO/RH(D): O POS

## 2022-04-21 SURGERY — NEPHRECTOMY, PARTIAL, ROBOT-ASSISTED
Anesthesia: General | Site: Renal | Laterality: Right

## 2022-04-21 MED ORDER — HYDROMORPHONE HCL 1 MG/ML IJ SOLN
INTRAMUSCULAR | Status: AC
  Administered 2022-04-21: 0.5 mg via INTRAVENOUS
  Filled 2022-04-21: qty 2

## 2022-04-21 MED ORDER — ACETAMINOPHEN 10 MG/ML IV SOLN
1000.0000 mg | Freq: Four times a day (QID) | INTRAVENOUS | Status: DC
  Administered 2022-04-21 – 2022-04-22 (×3): 1000 mg via INTRAVENOUS
  Filled 2022-04-21 (×3): qty 100

## 2022-04-21 MED ORDER — ACETAMINOPHEN 325 MG PO TABS: 325.0000 mg | ORAL_TABLET | Freq: Once | ORAL | Status: DC | PRN

## 2022-04-21 MED ORDER — ROCURONIUM BROMIDE 10 MG/ML (PF) SYRINGE
PREFILLED_SYRINGE | INTRAVENOUS | Status: AC
  Filled 2022-04-21: qty 10

## 2022-04-21 MED ORDER — CEFAZOLIN SODIUM-DEXTROSE 2-4 GM/100ML-% IV SOLN
2.0000 g | INTRAVENOUS | Status: AC
  Administered 2022-04-21: 2 g via INTRAVENOUS
  Filled 2022-04-21: qty 100

## 2022-04-21 MED ORDER — ROCURONIUM BROMIDE 10 MG/ML (PF) SYRINGE
PREFILLED_SYRINGE | INTRAVENOUS | Status: DC | PRN
  Administered 2022-04-21: 60 mg via INTRAVENOUS
  Administered 2022-04-21: 40 mg via INTRAVENOUS

## 2022-04-21 MED ORDER — ESCITALOPRAM OXALATE 10 MG PO TABS
5.0000 mg | ORAL_TABLET | Freq: Every day | ORAL | Status: DC
Start: 2022-04-21 — End: 2022-04-22
  Administered 2022-04-21: 5 mg via ORAL
  Filled 2022-04-21: qty 1

## 2022-04-21 MED ORDER — BUPIVACAINE-EPINEPHRINE 0.5% -1:200000 IJ SOLN
INTRAMUSCULAR | Status: DC | PRN
  Administered 2022-04-21: 15 mL

## 2022-04-21 MED ORDER — FENTANYL CITRATE (PF) 100 MCG/2ML IJ SOLN
INTRAMUSCULAR | Status: AC
  Filled 2022-04-21: qty 2

## 2022-04-21 MED ORDER — DEXAMETHASONE SODIUM PHOSPHATE 10 MG/ML IJ SOLN
INTRAMUSCULAR | Status: DC | PRN
  Administered 2022-04-21: 6 mg via INTRAVENOUS

## 2022-04-21 MED ORDER — LACTATED RINGERS IV SOLN: INTRAVENOUS | Status: DC

## 2022-04-21 MED ORDER — ORAL CARE MOUTH RINSE: 15.0000 mL | Freq: Once | OROMUCOSAL | Status: AC

## 2022-04-21 MED ORDER — SODIUM CHLORIDE 0.45 % IV SOLN: INTRAVENOUS | Status: DC

## 2022-04-21 MED ORDER — BUPIVACAINE LIPOSOME 1.3 % IJ SUSP
INTRAMUSCULAR | Status: AC
  Filled 2022-04-21: qty 20

## 2022-04-21 MED ORDER — PHENYLEPHRINE 80 MCG/ML (10ML) SYRINGE FOR IV PUSH (FOR BLOOD PRESSURE SUPPORT)
PREFILLED_SYRINGE | INTRAVENOUS | Status: DC | PRN
  Administered 2022-04-21: 160 ug via INTRAVENOUS
  Administered 2022-04-21: 80 ug via INTRAVENOUS
  Administered 2022-04-21 (×2): 160 ug via INTRAVENOUS

## 2022-04-21 MED ORDER — SODIUM CHLORIDE (PF) 0.9 % IJ SOLN
INTRAMUSCULAR | Status: DC | PRN
  Administered 2022-04-21: 10 mL

## 2022-04-21 MED ORDER — ALPRAZOLAM 0.25 MG PO TABS: 0.2500 mg | ORAL_TABLET | Freq: Every day | ORAL | Status: DC | PRN

## 2022-04-21 MED ORDER — ACETAMINOPHEN 10 MG/ML IV SOLN: 1000.0000 mg | Freq: Once | INTRAVENOUS | Status: DC | PRN

## 2022-04-21 MED ORDER — AMISULPRIDE (ANTIEMETIC) 5 MG/2ML IV SOLN: 10.0000 mg | Freq: Once | INTRAVENOUS | Status: DC | PRN

## 2022-04-21 MED ORDER — LIDOCAINE HCL (PF) 2 % IJ SOLN
INTRAMUSCULAR | Status: AC
Start: 2022-04-21 — End: ?
  Filled 2022-04-21: qty 5

## 2022-04-21 MED ORDER — FENTANYL CITRATE (PF) 100 MCG/2ML IJ SOLN
INTRAMUSCULAR | Status: DC | PRN
  Administered 2022-04-21 (×6): 50 ug via INTRAVENOUS

## 2022-04-21 MED ORDER — SUGAMMADEX SODIUM 200 MG/2ML IV SOLN
INTRAVENOUS | Status: DC | PRN
  Administered 2022-04-21: 200 mg via INTRAVENOUS

## 2022-04-21 MED ORDER — HEMOSTATIC AGENTS (NO CHARGE) OPTIME
TOPICAL | Status: DC | PRN
  Administered 2022-04-21: 1 via TOPICAL

## 2022-04-21 MED ORDER — PHENYLEPHRINE HCL-NACL 20-0.9 MG/250ML-% IV SOLN
INTRAVENOUS | Status: DC | PRN
  Administered 2022-04-21: 25 ug/min via INTRAVENOUS

## 2022-04-21 MED ORDER — HYDROMORPHONE HCL 1 MG/ML IJ SOLN
0.5000 mg | INTRAMUSCULAR | Status: DC | PRN
  Administered 2022-04-21 (×2): 1 mg via INTRAVENOUS
  Filled 2022-04-21 (×2): qty 1

## 2022-04-21 MED ORDER — BUPIVACAINE-EPINEPHRINE (PF) 0.5% -1:200000 IJ SOLN
INTRAMUSCULAR | Status: AC
  Filled 2022-04-21: qty 30

## 2022-04-21 MED ORDER — ROSUVASTATIN CALCIUM 5 MG PO TABS
5.0000 mg | ORAL_TABLET | ORAL | Status: DC
  Administered 2022-04-22: 5 mg via ORAL
  Filled 2022-04-21: qty 1

## 2022-04-21 MED ORDER — ORAL CARE MOUTH RINSE: 15.0000 mL | OROMUCOSAL | Status: DC | PRN

## 2022-04-21 MED ORDER — HYOSCYAMINE SULFATE 0.125 MG SL SUBL: 0.1250 mg | SUBLINGUAL_TABLET | SUBLINGUAL | Status: DC | PRN

## 2022-04-21 MED ORDER — DOCUSATE SODIUM 100 MG PO CAPS: 100.0000 mg | ORAL_CAPSULE | Freq: Two times a day (BID) | ORAL | Status: AC

## 2022-04-21 MED ORDER — SODIUM CHLORIDE (PF) 0.9 % IJ SOLN
INTRAMUSCULAR | Status: AC
  Filled 2022-04-21: qty 20

## 2022-04-21 MED ORDER — PHENYLEPHRINE HCL (PRESSORS) 10 MG/ML IV SOLN
INTRAVENOUS | Status: AC
  Filled 2022-04-21: qty 1

## 2022-04-21 MED ORDER — SURGIFLO WITH THROMBIN (HEMOSTATIC MATRIX KIT) OPTIME: TOPICAL | Status: DC | PRN

## 2022-04-21 MED ORDER — DIPHENHYDRAMINE HCL 50 MG/ML IJ SOLN: 12.5000 mg | Freq: Four times a day (QID) | INTRAMUSCULAR | Status: DC | PRN

## 2022-04-21 MED ORDER — MIDAZOLAM HCL 5 MG/5ML IJ SOLN
INTRAMUSCULAR | Status: DC | PRN
  Administered 2022-04-21: 2 mg via INTRAVENOUS

## 2022-04-21 MED ORDER — DEXAMETHASONE SODIUM PHOSPHATE 10 MG/ML IJ SOLN
INTRAMUSCULAR | Status: AC
  Filled 2022-04-21: qty 1

## 2022-04-21 MED ORDER — DIPHENHYDRAMINE HCL 12.5 MG/5ML PO ELIX: 12.5000 mg | ORAL_SOLUTION | Freq: Four times a day (QID) | ORAL | Status: DC | PRN

## 2022-04-21 MED ORDER — BUPIVACAINE LIPOSOME 1.3 % IJ SUSP
INTRAMUSCULAR | Status: DC | PRN
  Administered 2022-04-21: 20 mL

## 2022-04-21 MED ORDER — LIDOCAINE 2% (20 MG/ML) 5 ML SYRINGE
INTRAMUSCULAR | Status: DC | PRN
  Administered 2022-04-21: 80 mg via INTRAVENOUS

## 2022-04-21 MED ORDER — ONDANSETRON HCL 4 MG/2ML IJ SOLN
INTRAMUSCULAR | Status: DC | PRN
  Administered 2022-04-21: 4 mg via INTRAVENOUS

## 2022-04-21 MED ORDER — CHLORHEXIDINE GLUCONATE 0.12 % MT SOLN
15.0000 mL | Freq: Once | OROMUCOSAL | Status: AC
  Administered 2022-04-21: 15 mL via OROMUCOSAL

## 2022-04-21 MED ORDER — PROPOFOL 10 MG/ML IV BOLUS
INTRAVENOUS | Status: DC | PRN
  Administered 2022-04-21: 150 mg via INTRAVENOUS

## 2022-04-21 MED ORDER — PROPOFOL 10 MG/ML IV BOLUS
INTRAVENOUS | Status: AC
Start: 2022-04-21 — End: ?
  Filled 2022-04-21: qty 20

## 2022-04-21 MED ORDER — LACTATED RINGERS IR SOLN
Status: DC | PRN
  Administered 2022-04-21: 1000 mL

## 2022-04-21 MED ORDER — TRAMADOL HCL 50 MG PO TABS: 50.0000 mg | ORAL_TABLET | Freq: Four times a day (QID) | ORAL | 0 refills | Status: AC | PRN

## 2022-04-21 MED ORDER — STERILE WATER FOR IRRIGATION IR SOLN
Status: DC | PRN
  Administered 2022-04-21: 1000 mL

## 2022-04-21 MED ORDER — POLYETHYLENE GLYCOL 3350 17 GM/SCOOP PO POWD: 1.0000 | Freq: Once | ORAL | Status: DC

## 2022-04-21 MED ORDER — MIDAZOLAM HCL 2 MG/2ML IJ SOLN
INTRAMUSCULAR | Status: AC
  Filled 2022-04-21: qty 2

## 2022-04-21 MED ORDER — ONDANSETRON HCL 4 MG/2ML IJ SOLN
INTRAMUSCULAR | Status: AC
  Filled 2022-04-21: qty 2

## 2022-04-21 MED ORDER — ONDANSETRON HCL 4 MG/2ML IJ SOLN
4.0000 mg | INTRAMUSCULAR | Status: DC | PRN
  Administered 2022-04-21: 4 mg via INTRAVENOUS
  Filled 2022-04-21: qty 2

## 2022-04-21 MED ORDER — HYDROMORPHONE HCL 1 MG/ML IJ SOLN
0.2500 mg | INTRAMUSCULAR | Status: DC | PRN
  Administered 2022-04-21: 0.5 mg via INTRAVENOUS

## 2022-04-21 MED ORDER — ACETAMINOPHEN 160 MG/5ML PO SOLN: 325.0000 mg | Freq: Once | ORAL | Status: DC | PRN

## 2022-04-21 MED ORDER — DOCUSATE SODIUM 100 MG PO CAPS
100.0000 mg | ORAL_CAPSULE | Freq: Two times a day (BID) | ORAL | Status: DC
  Administered 2022-04-21 – 2022-04-22 (×3): 100 mg via ORAL
  Filled 2022-04-21 (×3): qty 1

## 2022-04-21 MED ORDER — SCOPOLAMINE 1 MG/3DAYS TD PT72
1.0000 | MEDICATED_PATCH | TRANSDERMAL | Status: DC
  Administered 2022-04-21: 1 via TRANSDERMAL
  Filled 2022-04-21: qty 1

## 2022-04-21 MED ORDER — TRAMADOL HCL 50 MG PO TABS
50.0000 mg | ORAL_TABLET | Freq: Four times a day (QID) | ORAL | Status: DC | PRN
  Administered 2022-04-21: 100 mg via ORAL
  Administered 2022-04-22: 50 mg via ORAL
  Administered 2022-04-22: 100 mg via ORAL
  Filled 2022-04-21 (×3): qty 2

## 2022-04-21 MED ORDER — MEPERIDINE HCL 50 MG/ML IJ SOLN: 6.2500 mg | INTRAMUSCULAR | Status: DC | PRN

## 2022-04-21 SURGICAL SUPPLY — 77 items
APPLICATOR ARISTA FLEXITIP XL (MISCELLANEOUS) IMPLANT
APPLICATOR SURGIFLO ENDO (HEMOSTASIS) ×2 IMPLANT
BAG COUNTER SPONGE SURGICOUNT (BAG) IMPLANT
CHLORAPREP W/TINT 26 (MISCELLANEOUS) ×2 IMPLANT
CLIP LIGATING HEM O LOK PURPLE (MISCELLANEOUS) ×2 IMPLANT
CLIP LIGATING HEMO LOK XL GOLD (MISCELLANEOUS) IMPLANT
CLIP LIGATING HEMO O LOK GREEN (MISCELLANEOUS) ×5 IMPLANT
COVER SURGICAL LIGHT HANDLE (MISCELLANEOUS) ×2 IMPLANT
COVER TIP SHEARS 8 DVNC (MISCELLANEOUS) ×1 IMPLANT
COVER TIP SHEARS 8MM DA VINCI (MISCELLANEOUS) ×1
CUTTER ECHEON FLEX ENDO 45 340 (ENDOMECHANICALS) IMPLANT
DERMABOND ADVANCED (GAUZE/BANDAGES/DRESSINGS) ×1
DERMABOND ADVANCED .7 DNX12 (GAUZE/BANDAGES/DRESSINGS) ×1 IMPLANT
DRAIN CHANNEL 15F RND FF 3/16 (WOUND CARE) ×2 IMPLANT
DRAPE ARM DVNC X/XI (DISPOSABLE) ×4 IMPLANT
DRAPE COLUMN DVNC XI (DISPOSABLE) ×1 IMPLANT
DRAPE DA VINCI XI ARM (DISPOSABLE) ×4
DRAPE DA VINCI XI COLUMN (DISPOSABLE) ×1
DRAPE INCISE IOBAN 66X45 STRL (DRAPES) ×2 IMPLANT
DRAPE SHEET LG 3/4 BI-LAMINATE (DRAPES) ×2 IMPLANT
DRSG TEGADERM 4X4.75 (GAUZE/BANDAGES/DRESSINGS) ×1 IMPLANT
ELECT PENCIL ROCKER SW 15FT (MISCELLANEOUS) ×2 IMPLANT
ELECT REM PT RETURN 15FT ADLT (MISCELLANEOUS) ×2 IMPLANT
EVACUATOR SILICONE 100CC (DRAIN) ×2 IMPLANT
GAUZE SPONGE 4X4 12PLY STRL (GAUZE/BANDAGES/DRESSINGS) ×1 IMPLANT
GLOVE BIO SURGEON STRL SZ 6.5 (GLOVE) ×2 IMPLANT
GLOVE SURG LX 7.5 STRW (GLOVE) ×2
GLOVE SURG LX STRL 7.5 STRW (GLOVE) ×2 IMPLANT
GOWN STRL REUS W/ TWL LRG LVL3 (GOWN DISPOSABLE) ×2 IMPLANT
GOWN STRL REUS W/ TWL XL LVL3 (GOWN DISPOSABLE) ×2 IMPLANT
GOWN STRL REUS W/TWL LRG LVL3 (GOWN DISPOSABLE) ×2
GOWN STRL REUS W/TWL XL LVL3 (GOWN DISPOSABLE) ×2
HEMOSTAT ARISTA ABSORB 3G PWDR (HEMOSTASIS) IMPLANT
HOLDER FOLEY CATH W/STRAP (MISCELLANEOUS) ×1 IMPLANT
IRRIG SUCT STRYKERFLOW 2 WTIP (MISCELLANEOUS) ×2
IRRIGATION SUCT STRKRFLW 2 WTP (MISCELLANEOUS) ×1 IMPLANT
KIT BASIN OR (CUSTOM PROCEDURE TRAY) ×2 IMPLANT
KIT TURNOVER KIT A (KITS) ×1 IMPLANT
LOOP VESSEL MAXI BLUE (MISCELLANEOUS) ×2 IMPLANT
MARKER SKIN DUAL TIP RULER LAB (MISCELLANEOUS) ×2 IMPLANT
NDL INSUFFLATION 14GA 120MM (NEEDLE) IMPLANT
NEEDLE INSUFFLATION 14GA 120MM (NEEDLE) ×2 IMPLANT
NS IRRIG 1000ML POUR BTL (IV SOLUTION) ×2 IMPLANT
PAD POSITIONING PINK XL (MISCELLANEOUS) ×2 IMPLANT
PROTECTOR NERVE ULNAR (MISCELLANEOUS) ×4 IMPLANT
RELOAD STAPLE 45 2.6 WHT THIN (STAPLE) IMPLANT
SEAL CANN UNIV 5-8 DVNC XI (MISCELLANEOUS) ×4 IMPLANT
SEAL XI 5MM-8MM UNIVERSAL (MISCELLANEOUS) ×4
SET TUBE SMOKE EVAC HIGH FLOW (TUBING) ×2 IMPLANT
SOLUTION ELECTROLUBE (MISCELLANEOUS) ×2 IMPLANT
SPIKE FLUID TRANSFER (MISCELLANEOUS) ×2 IMPLANT
STAPLE RELOAD 45 WHT (STAPLE) IMPLANT
STAPLE RELOAD 45MM WHITE (STAPLE)
SURGIFLO W/THROMBIN 8M KIT (HEMOSTASIS) ×2 IMPLANT
SUT ETHILON 3 0 PS 1 (SUTURE) ×2 IMPLANT
SUT MNCRL AB 4-0 PS2 18 (SUTURE) ×4 IMPLANT
SUT V-LOC BARB 180 2/0GR6 GS22 (SUTURE) ×2
SUT V-LOC BARB 180 2/0GR9 GS23 (SUTURE) ×2
SUT VIC AB 0 CT1 27 (SUTURE) ×1
SUT VIC AB 0 CT1 27XBRD ANTBC (SUTURE) ×1 IMPLANT
SUT VIC AB 3-0 SH 27 (SUTURE) ×1
SUT VIC AB 3-0 SH 27XBRD (SUTURE) IMPLANT
SUT VICRYL 0 UR6 27IN ABS (SUTURE) IMPLANT
SUT VLOC BARB 180 ABS3/0GR12 (SUTURE) ×4
SUTURE V-LC BRB 180 2/0GR6GS22 (SUTURE) ×1 IMPLANT
SUTURE V-LC BRB 180 2/0GR9GS23 (SUTURE) IMPLANT
SUTURE VLOC BRB 180 ABS3/0GR12 (SUTURE) ×1 IMPLANT
SYS BAG RETRIEVAL 10MM (BASKET) ×2
SYSTEM BAG RETRIEVAL 10MM (BASKET) ×1 IMPLANT
TOWEL OR 17X26 10 PK STRL BLUE (TOWEL DISPOSABLE) ×2 IMPLANT
TOWEL OR NON WOVEN STRL DISP B (DISPOSABLE) ×2 IMPLANT
TRAY FOLEY MTR SLVR 16FR STAT (SET/KITS/TRAYS/PACK) ×2 IMPLANT
TRAY LAPAROSCOPIC (CUSTOM PROCEDURE TRAY) ×2 IMPLANT
TROCAR ADV FIXATION 12X100MM (TROCAR) ×2 IMPLANT
TROCAR XCEL 12X100 BLDLESS (ENDOMECHANICALS) ×1 IMPLANT
TROCAR Z-THREAD OPTICAL 5X100M (TROCAR) ×1 IMPLANT
WATER STERILE IRR 1000ML POUR (IV SOLUTION) ×2 IMPLANT

## 2022-04-21 NOTE — Transfer of Care (Signed)
Immediate Anesthesia Transfer of Care Note  Patient: Christina Brown  Procedure(s) Performed: XI ROBOTIC ASSITED PARTIAL NEPHRECTOMY (Right: Renal)  Patient Location: PACU  Anesthesia Type:General  Level of Consciousness: drowsy and patient cooperative  Airway & Oxygen Therapy: Patient Spontanous Breathing and Patient connected to face mask oxygen  Post-op Assessment: Report given to RN and Post -op Vital signs reviewed and stable  Post vital signs: Reviewed and stable  Last Vitals:  Vitals Value Taken Time  BP 148/84 04/21/22 1026  Temp    Pulse 73 04/21/22 1029  Resp 13 04/21/22 1029  SpO2 98 % 04/21/22 1029  Vitals shown include unvalidated device data.  Last Pain:  Vitals:   04/21/22 0545  TempSrc:   PainSc: 0-No pain         Complications: No notable events documented.

## 2022-04-21 NOTE — Anesthesia Procedure Notes (Addendum)
Procedure Name: Intubation Date/Time: 04/21/2022 7:33 AM  Performed by: West Pugh, CRNAPre-anesthesia Checklist: Patient identified, Emergency Drugs available, Suction available, Patient being monitored and Timeout performed Patient Re-evaluated:Patient Re-evaluated prior to induction Oxygen Delivery Method: Circle system utilized Preoxygenation: Pre-oxygenation with 100% oxygen Induction Type: IV induction Ventilation: Mask ventilation without difficulty and Oral airway inserted - appropriate to patient size Laryngoscope Size: Mac and 3 Grade View: Grade I Tube type: Oral Number of attempts: 1 Airway Equipment and Method: Stylet Placement Confirmation: ETT inserted through vocal cords under direct vision, positive ETCO2, CO2 detector and breath sounds checked- equal and bilateral Secured at: 22 cm Tube secured with: Tape Dental Injury: Teeth and Oropharynx as per pre-operative assessment

## 2022-04-21 NOTE — H&P (Signed)
cc: renal lesion   12/03/21: 51 year old woman found to have 1 cm right renal lesion on CT scan. Subsequent MRI of the kidney showed a Bosniak 2 cyst in the medial upper pole of the left kidney and a 9 mm subcapsular lesion in the anterior midpole of the right kidney. Due to the small size it was difficult to characterize and there is very mild rim enhancement. Patient denies any gross hematuria, flank pain or family. She does have a tobacco history of approximately 20-30-pack-year history but quit 5 years ago.   01/12/22: 51 year old woman with a right sided renal lesion seen on CT scan and MRI who had follow-up MRI to evaluate the right renal lesion. MRI shows a 2.1 cm enhancing right renal lesion concerning for neoplasm.   Interval: Today the patient follows up for discussion of partial nephrectomy. She has noted slight interval growth of the renal mass on the anterior aspect of the right kidney.   The patient recently had a breast biopsy for a fibroadenoma she has no other past surgical history. She works in the school system, and would like to have surgery scheduled sometime this summer.     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 Has not smoked since 12/01/2016. Smoked for 25 years. Smoked less than 1/2 pack per day.   Tobacco Use Assessment Completed: Used Tobacco in last 30 days? Has never drank.  Does not drink caffeine. 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    REVIEW OF SYSTEMS:    GU Review Female:   Patient denies frequent urination, hard to postpone urination, burning /pain with urination, get up at night to urinate, leakage of urine, stream starts and stops, trouble starting your stream, have to strain to urinate, and being pregnant.  Gastrointestinal (Upper):   Patient denies nausea, vomiting, and indigestion/ heartburn.  Gastrointestinal (Lower):   Patient denies diarrhea and constipation.  Constitutional:   Patient denies fever, night sweats, weight loss, and fatigue.  Skin:   Patient denies skin rash/ lesion and itching.  Eyes:   Patient denies blurred vision and double vision.  Ears/ Nose/ Throat:   Patient denies sore throat and sinus problems.  Hematologic/Lymphatic:   Patient denies swollen glands and easy bruising.  Cardiovascular:   Patient denies leg swelling and chest pains.  Respiratory:   Patient denies cough and shortness of breath.  Endocrine:   Patient denies excessive thirst.  Musculoskeletal:   Patient denies back pain and joint pain.  Neurological:   Patient denies headaches and dizziness.  Psychologic:   Patient denies depression and anxiety.   VITAL SIGNS: None   MULTI-SYSTEM PHYSICAL EXAMINATION:    Constitutional: Well-nourished. No physical deformities.  Normally developed. Good grooming.   Respiratory: Normal breath sounds. No labored breathing, no use of accessory muscles.   Cardiovascular: Regular rate and rhythm. No murmur, no gallop. Normal temperature, normal extremity pulses, no swelling, no varicosities.      Complexity of Data:  Source Of History:  Patient  Records Review:   Previous Doctor Records, Previous Patient Records, POC Tool  Urine Test  Review:   Urinalysis  X-Ray Review: MRI Abdomen: Reviewed Films. Discussed With Patient.     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 I went over the robotic-assisted laparoscopic partial nephrectomy approach. I described for the patient the procedure in detail including port placement. I detailed the postoperative course including the fact that the patient would have both a drain and a Foley catheter following the surgery. I told the patient that most often patients are discharged on postoperative day one or 2. I then detailed the expected recovery time, I told the patient that he would not be able to lift anything greater than 20 pounds for 4 weeks. I also went over the risks and benefits of this operation in great detail. We discussed the risk of injury to surrounding structures, major blood vessels and nerves, bleeding, infection, loss of kidney, and the risk of recurrent cancer.   The patient has a simple renal hilum with what appears to be 1 small renal artery and a small vein. There is also a lower pole vein is quite small.

## 2022-04-21 NOTE — Interval H&P Note (Signed)
History and Physical Interval Note:  04/21/2022 7:04 AM  Christina Brown  has presented today for surgery, with the diagnosis of RIGHT RENAL MASS.  The various methods of treatment have been discussed with the patient and family. After consideration of risks, benefits and other options for treatment, the patient has consented to  Procedure(s) with comments: XI ROBOTIC ASSITED PARTIAL NEPHRECTOMY (Right) - 4 HRS FOR THIS CASE as a surgical intervention.  The patient's history has been reviewed, patient examined, no change in status, stable for surgery.  I have reviewed the patient's chart and labs.  Questions were answered to the patient's satisfaction.     Ardis Hughs

## 2022-04-21 NOTE — Interval H&P Note (Signed)
History and Physical Interval Note:  04/21/2022 7:14 AM  Christina Brown  has presented today for surgery, with the diagnosis of RIGHT RENAL MASS.  The various methods of treatment have been discussed with the patient and family. After consideration of risks, benefits and other options for treatment, the patient has consented to  Procedure(s) with comments: XI ROBOTIC ASSITED PARTIAL NEPHRECTOMY (Right) - 4 HRS FOR THIS CASE as a surgical intervention.  The patient's history has been reviewed, patient examined, no change in status, stable for surgery.  I have reviewed the patient's chart and labs.  Questions were answered to the patient's satisfaction.     Ardis Hughs

## 2022-04-21 NOTE — Discharge Instructions (Signed)

## 2022-04-21 NOTE — Anesthesia Preprocedure Evaluation (Addendum)
Anesthesia Evaluation  Patient identified by MRN, date of birth, ID band Patient awake    Reviewed: Allergy & Precautions, NPO status , Patient's Chart, lab work & pertinent test results  Airway Mallampati: I  TM Distance: >3 FB Neck ROM: Full    Dental  (+) Edentulous Upper, Edentulous Lower   Pulmonary former smoker,    breath sounds clear to auscultation       Cardiovascular negative cardio ROS   Rhythm:Regular Rate:Normal     Neuro/Psych PSYCHIATRIC DISORDERS Anxiety Depression    GI/Hepatic Neg liver ROS, GERD  ,  Endo/Other  Hyperthyroidism   Renal/GU      Musculoskeletal  (+) Arthritis ,   Abdominal Normal abdominal exam  (+)   Peds  Hematology   Anesthesia Other Findings   Reproductive/Obstetrics                            Anesthesia Physical Anesthesia Plan  ASA: 2  Anesthesia Plan: General   Post-op Pain Management:    Induction: Intravenous  PONV Risk Score and Plan: 4 or greater and Ondansetron, Dexamethasone, Midazolam and Scopolamine patch - Pre-op  Airway Management Planned: Oral ETT  Additional Equipment: None  Intra-op Plan:   Post-operative Plan: Extubation in OR  Informed Consent: I have reviewed the patients History and Physical, chart, labs and discussed the procedure including the risks, benefits and alternatives for the proposed anesthesia with the patient or authorized representative who has indicated his/her understanding and acceptance.     Dental advisory given  Plan Discussed with: CRNA  Anesthesia Plan Comments: (- 2 IV's)       Anesthesia Quick Evaluation

## 2022-04-22 ENCOUNTER — Encounter (HOSPITAL_COMMUNITY): Payer: Self-pay | Admitting: Urology

## 2022-04-22 DIAGNOSIS — C641 Malignant neoplasm of right kidney, except renal pelvis: Secondary | ICD-10-CM | POA: Diagnosis not present

## 2022-04-22 LAB — BASIC METABOLIC PANEL
Anion gap: 6 (ref 5–15)
BUN: 10 mg/dL (ref 6–20)
CO2: 25 mmol/L (ref 22–32)
Calcium: 8.6 mg/dL — ABNORMAL LOW (ref 8.9–10.3)
Chloride: 107 mmol/L (ref 98–111)
Creatinine, Ser: 0.83 mg/dL (ref 0.44–1.00)
GFR, Estimated: >60 mL/min (ref >60)
Glucose, Bld: 113 mg/dL — ABNORMAL HIGH (ref 70–99)
Potassium: 3.9 mmol/L (ref 3.5–5.1)
Sodium: 138 mmol/L (ref 135–145)

## 2022-04-22 LAB — HEMOGLOBIN AND HEMATOCRIT, BLOOD
HCT: 35.5 % — ABNORMAL LOW (ref 36.0–46.0)
Hemoglobin: 11.6 g/dL — ABNORMAL LOW (ref 12.0–15.0)

## 2022-04-22 LAB — SURGICAL PATHOLOGY

## 2022-04-22 NOTE — Progress Notes (Signed)
Urology Inpatient Progress Report  Renal mass [N28.89]  Procedure(s): XI ROBOTIC ASSITED PARTIAL NEPHRECTOMY  1 Day Post-Op   Intv/Subj: No acute events overnight. Patient is without complaint.  Principal Problem:   Renal mass  Current Facility-Administered Medications  Medication Dose Route Frequency Provider Last Rate Last Admin   acetaminophen (OFIRMEV) IV 1,000 mg  1,000 mg Intravenous Q6H Dancy, Marchelle Folks, PA-C 400 mL/hr at 04/22/22 0624 1,000 mg at 04/22/22 1601   ALPRAZolam (XANAX) tablet 0.25 mg  0.25 mg Oral Daily PRN Harrie Foreman, PA-C       diphenhydrAMINE (BENADRYL) injection 12.5-25 mg  12.5-25 mg Intravenous Q6H PRN Harrie Foreman, PA-C       Or   diphenhydrAMINE (BENADRYL) 12.5 MG/5ML elixir 12.5-25 mg  12.5-25 mg Oral Q6H PRN Harrie Foreman, PA-C       docusate sodium (COLACE) capsule 100 mg  100 mg Oral BID Harrie Foreman, PA-C   100 mg at 04/21/22 2126   escitalopram (LEXAPRO) tablet 5 mg  5 mg Oral Daily Harrie Foreman, PA-C   5 mg at 04/21/22 2126   HYDROmorphone (DILAUDID) injection 0.5-1 mg  0.5-1 mg Intravenous Q2H PRN Harrie Foreman, PA-C   1 mg at 04/21/22 1731   hyoscyamine (LEVSIN SL) SL tablet 0.125 mg  0.125 mg Sublingual Q4H PRN Harrie Foreman, PA-C       ondansetron (ZOFRAN) injection 4 mg  4 mg Intravenous Q4H PRN Harrie Foreman, PA-C   4 mg at 04/21/22 1811   Oral care mouth rinse  15 mL Mouth Rinse PRN Crist Fat, MD       rosuvastatin (CRESTOR) tablet 5 mg  5 mg Oral Q M,W,F Dancy, Amanda, PA-C       traMADol (ULTRAM) tablet 50-100 mg  50-100 mg Oral Q6H PRN Harrie Foreman, PA-C   50 mg at 04/22/22 0448     Objective: Vital: Vitals:   04/21/22 1910 04/21/22 2229 04/22/22 0232 04/22/22 0637  BP: 93/66 101/61 98/66 97/67   Pulse: 77 71 69 63  Resp: 16 15 16 16   Temp: 98.5 F (36.9 C) 98.1 F (36.7 C) 98.3 F (36.8 C) 98.1 F (36.7 C)  TempSrc: Oral Oral Oral Oral  SpO2: 92% 95% 96% 97%  Weight:      Height:       I/Os: I/O last 3  completed shifts: In: 4774.9 [P.O.:960; I.V.:3514.9; IV Piggyback:300] Out: 1420 [Urine:1300; Drains:70; Blood:50]  Physical Exam:  General: Patient is in no apparent distress Lungs: Normal respiratory effort, chest expands symmetrically. GI: Incisions are c/d/i.  The abdomen is soft and nontender without mass. JP drain with serosanguinous drainage Foley: clear yellow  Ext: lower extremities symmetric  Lab Results: Recent Labs    04/21/22 1039 04/22/22 0433  HGB 12.5 11.6*  HCT 38.8 35.5*   Recent Labs    04/22/22 0433  NA 138  K 3.9  CL 107  CO2 25  GLUCOSE 113*  BUN 10  CREATININE 0.83  CALCIUM 8.6*   No results for input(s): "LABPT", "INR" in the last 72 hours. No results for input(s): "LABURIN" in the last 72 hours. Results for orders placed or performed during the hospital encounter of 04/15/22  Urine Culture     Status: Abnormal   Collection Time: 04/15/22  2:11 PM   Specimen: Urine, Clean Catch  Result Value Ref Range Status   Specimen Description   Final    URINE, CLEAN CATCH Performed at [facility], [address] [place], [place]  [address]    Special Requests   Final    NONE Performed at [facility], [address] [place], [place] [address]    Culture MULTIPLE SPECIES PRESENT, SUGGEST RECOLLECTION (A)  Final   Report Status 04/16/2022 FINAL  Final    Studies/Results: No results found.  Assessment: Procedure(s): XI ROBOTIC ASSITED PARTIAL NEPHRECTOMY, 1 Day Post-Op  doing well.  Plan: D/c drain HLIVF Activity ad lib ADAT Home by lunch   Berniece Salines, MD Urology 04/22/2022, 7:38 AM

## 2022-07-12 ENCOUNTER — Other Ambulatory Visit (HOSPITAL_COMMUNITY): Payer: Self-pay | Admitting: Adult Health Nurse Practitioner

## 2022-07-12 DIAGNOSIS — N644 Mastodynia: Secondary | ICD-10-CM

## 2022-07-20 ENCOUNTER — Ambulatory Visit (HOSPITAL_COMMUNITY)
Admission: RE | Admit: 2022-07-20 | Discharge: 2022-07-20 | Disposition: A | Discharge status: Home / Self Care | Payer: BC Managed Care – PPO | Source: Ambulatory Visit | Attending: Nurse Practitioner | Admitting: Nurse Practitioner

## 2022-07-20 ENCOUNTER — Other Ambulatory Visit (HOSPITAL_COMMUNITY): Payer: Self-pay | Admitting: Nurse Practitioner

## 2022-07-20 ENCOUNTER — Ambulatory Visit: Admission: RE | Admit: 2022-07-20 | Payer: BC Managed Care – PPO | Source: Ambulatory Visit

## 2022-07-20 DIAGNOSIS — C641 Malignant neoplasm of right kidney, except renal pelvis: Secondary | ICD-10-CM

## 2022-08-09 ENCOUNTER — Other Ambulatory Visit (HOSPITAL_COMMUNITY): Payer: Self-pay | Admitting: Adult Health Nurse Practitioner

## 2022-08-09 ENCOUNTER — Ambulatory Visit (HOSPITAL_COMMUNITY)
Admission: RE | Admit: 2022-08-09 | Discharge: 2022-08-09 | Disposition: A | Discharge status: Home / Self Care | Payer: BC Managed Care – PPO | Source: Ambulatory Visit | Attending: Adult Health Nurse Practitioner | Admitting: Adult Health Nurse Practitioner

## 2022-08-09 ENCOUNTER — Encounter (HOSPITAL_COMMUNITY): Payer: Self-pay

## 2022-08-09 DIAGNOSIS — N644 Mastodynia: Secondary | ICD-10-CM

## 2022-08-15 ENCOUNTER — Ambulatory Visit
Admission: RE | Admit: 2022-08-15 | Discharge: 2022-08-15 | Disposition: A | Discharge status: Home / Self Care | Payer: Self-pay | Source: Ambulatory Visit | Attending: Adult Health Nurse Practitioner | Admitting: Adult Health Nurse Practitioner

## 2022-08-15 ENCOUNTER — Other Ambulatory Visit (HOSPITAL_COMMUNITY): Payer: Self-pay | Admitting: Adult Health Nurse Practitioner

## 2022-08-15 ENCOUNTER — Inpatient Hospital Stay
Admission: RE | Admit: 2022-08-15 | Discharge: 2022-08-15 | Disposition: A | Discharge status: Home / Self Care | Payer: Self-pay | Source: Ambulatory Visit | Attending: Adult Health Nurse Practitioner | Admitting: Adult Health Nurse Practitioner

## 2022-08-15 DIAGNOSIS — N644 Mastodynia: Secondary | ICD-10-CM

## 2022-08-18 ENCOUNTER — Ambulatory Visit (HOSPITAL_COMMUNITY)
Admission: RE | Admit: 2022-08-18 | Discharge: 2022-08-18 | Disposition: A | Discharge status: Home / Self Care | Payer: BC Managed Care – PPO | Source: Ambulatory Visit | Attending: Adult Health Nurse Practitioner | Admitting: Adult Health Nurse Practitioner

## 2022-08-18 ENCOUNTER — Encounter (HOSPITAL_COMMUNITY): Payer: BC Managed Care – PPO

## 2022-08-18 DIAGNOSIS — N644 Mastodynia: Secondary | ICD-10-CM

## 2023-01-05 ENCOUNTER — Other Ambulatory Visit (HOSPITAL_COMMUNITY): Payer: Self-pay | Admitting: Adult Health Nurse Practitioner

## 2023-01-05 DIAGNOSIS — Z1231 Encounter for screening mammogram for malignant neoplasm of breast: Secondary | ICD-10-CM

## 2023-01-12 ENCOUNTER — Encounter (HOSPITAL_COMMUNITY): Payer: Self-pay

## 2023-01-12 ENCOUNTER — Ambulatory Visit (HOSPITAL_COMMUNITY)
Admission: RE | Admit: 2023-01-12 | Discharge: 2023-01-12 | Disposition: A | Discharge status: Home / Self Care | Payer: BC Managed Care – PPO | Source: Ambulatory Visit | Attending: Adult Health Nurse Practitioner | Admitting: Adult Health Nurse Practitioner

## 2023-01-12 DIAGNOSIS — Z1231 Encounter for screening mammogram for malignant neoplasm of breast: Secondary | ICD-10-CM | POA: Insufficient documentation

## 2023-06-29 ENCOUNTER — Other Ambulatory Visit: Payer: Self-pay | Admitting: Urology

## 2023-06-29 ENCOUNTER — Encounter: Payer: Self-pay | Admitting: Urology

## 2023-06-29 DIAGNOSIS — C642 Malignant neoplasm of left kidney, except renal pelvis: Secondary | ICD-10-CM

## 2023-07-20 ENCOUNTER — Ambulatory Visit
Admission: RE | Admit: 2023-07-20 | Discharge: 2023-07-20 | Disposition: A | Discharge status: Home / Self Care | Payer: BC Managed Care – PPO | Source: Ambulatory Visit | Attending: Urology | Admitting: Urology

## 2023-07-20 DIAGNOSIS — C642 Malignant neoplasm of left kidney, except renal pelvis: Secondary | ICD-10-CM

## 2023-07-20 MED ORDER — IOPAMIDOL (ISOVUE-370) INJECTION 76%
500.0000 mL | Freq: Once | INTRAVENOUS | Status: AC | PRN
  Administered 2023-07-20: 80 mL via INTRAVENOUS
